# Patient Record
Sex: Female | Born: 1965 | Hispanic: No | Marital: Single
Health system: Southern US, Community
[De-identification: ages and names within clinical notes are randomized; demographics above are authoritative.]

---

## 2004-12-07 ENCOUNTER — Emergency Department: Payer: Self-pay | Admitting: Emergency Medicine

## 2004-12-08 ENCOUNTER — Ambulatory Visit: Payer: Self-pay | Admitting: Emergency Medicine

## 2006-08-05 ENCOUNTER — Encounter
Admission: RE | Admit: 2006-08-05 | Discharge: 2006-08-05 | Payer: Self-pay | Admitting: Physical Medicine and Rehabilitation

## 2007-12-14 ENCOUNTER — Emergency Department: Payer: Self-pay | Admitting: Unknown Physician Specialty

## 2007-12-14 ENCOUNTER — Other Ambulatory Visit: Payer: Self-pay

## 2008-04-23 ENCOUNTER — Ambulatory Visit: Payer: Self-pay

## 2017-04-13 ENCOUNTER — Ambulatory Visit: Payer: Self-pay | Attending: Oncology

## 2017-04-13 ENCOUNTER — Ambulatory Visit
Admission: RE | Admit: 2017-04-13 | Discharge: 2017-04-13 | Disposition: A | Discharge status: Home / Self Care | Payer: Self-pay | Source: Ambulatory Visit | Attending: Oncology | Admitting: Oncology

## 2017-04-13 VITALS — BP 114/79 | HR 92 | Temp 97.9°F | Ht 63.0 in | Wt 163.0 lb

## 2017-04-13 DIAGNOSIS — Z0000 Encounter for general adult medical examination without abnormal findings: Secondary | ICD-10-CM

## 2017-04-13 NOTE — Progress Notes (Signed)
Subjective:     Patient ID: Cynthia DonathClara Jaimes Oliver, female   DOB: May 15, 1966, 51 y.o.   MRN: [id number]  HPI   Review of Systems     Objective:   Physical Exam     Assessment:     See note    Plan:     See above

## 2017-04-13 NOTE — Progress Notes (Signed)
Subjective:     Patient ID: Cynthia DonathClara Jaimes Oliver, female   DOB: 06/28/1966, 51 y.o.   MRN: [id number]  HPI   Review of Systems     Objective:   Physical Exam  Pulmonary/Chest: Right breast exhibits no inverted nipple, no mass, no nipple discharge, no skin change and no tenderness. Left breast exhibits no inverted nipple, no mass, no nipple discharge, no skin change and no tenderness. Breasts are symmetrical.  Genitourinary: No labial fusion. There is no rash, tenderness, lesion or injury on the right labia. There is no rash, tenderness, lesion or injury on the left labia. Uterus is not deviated, not enlarged, not fixed and not tender. Cervix exhibits no motion tenderness, no discharge and no friability. Right adnexum displays no mass, no tenderness and no fullness. Left adnexum displays no mass, no tenderness and no fullness. No erythema, tenderness or bleeding in the vagina. No foreign body in the vagina. No signs of injury around the vagina. No vaginal discharge found.       Assessment:     51 year old hispanic patient presents for [facility] clinic visit.  Patient screened, and meets [facility] eligibility.  Patient does not have insurance, Medicare or Medicaid.  Handout given on Affordable Care Act. Instructed patient on breast self-exam using teach back method.  CBE unremarkable.  No mass or lump palpated.  Patient had a negative/positive pap in 2018. Pelvic exam normal. Cynthia Oliver interpreted exam.     Plan:     Sent for bilateral screening mammogram.  Specimen collected for pap.  Mailed patient information on HPV.

## 2017-04-14 ENCOUNTER — Other Ambulatory Visit: Payer: Self-pay

## 2017-04-14 DIAGNOSIS — N63 Unspecified lump in unspecified breast: Secondary | ICD-10-CM

## 2017-04-15 LAB — PAP LB AND HPV HIGH-RISK
HPV, high-risk: NEGATIVE
PAP SMEAR COMMENT: 0

## 2017-04-18 NOTE — Progress Notes (Signed)
Pap results Negative/Negative.   Patient to return for additional views.  Orders are in.

## 2017-04-27 ENCOUNTER — Ambulatory Visit
Admission: RE | Admit: 2017-04-27 | Discharge: 2017-04-27 | Disposition: A | Discharge status: Home / Self Care | Payer: Self-pay | Source: Ambulatory Visit | Attending: Oncology | Admitting: Oncology

## 2017-04-27 ENCOUNTER — Encounter: Payer: Self-pay | Admitting: Radiology

## 2017-04-27 DIAGNOSIS — N63 Unspecified lump in unspecified breast: Secondary | ICD-10-CM

## 2017-05-03 ENCOUNTER — Encounter: Payer: Self-pay | Admitting: Family Medicine

## 2017-05-25 ENCOUNTER — Other Ambulatory Visit: Payer: Self-pay

## 2017-05-25 DIAGNOSIS — N63 Unspecified lump in unspecified breast: Secondary | ICD-10-CM

## 2017-05-26 NOTE — Progress Notes (Signed)
Additional view mammogram results Birads 3.  Mailed 6 month follow-up appointment information to patient.  She is scheduled for left breast mammogram, and ultrasound on 10/27/17 at 10:00 [facility]

## 2017-10-04 NOTE — Progress Notes (Signed)
Patient called stating she is aware of her mammogram, and ultrasound appointment 10/27/17 at 10:00.

## 2017-10-27 ENCOUNTER — Ambulatory Visit
Admission: RE | Admit: 2017-10-27 | Discharge: 2017-10-27 | Disposition: A | Discharge status: Home / Self Care | Payer: Self-pay | Source: Ambulatory Visit | Attending: Oncology | Admitting: Oncology

## 2017-10-27 DIAGNOSIS — N6321 Unspecified lump in the left breast, upper outer quadrant: Secondary | ICD-10-CM | POA: Insufficient documentation

## 2017-10-27 DIAGNOSIS — N6322 Unspecified lump in the left breast, upper inner quadrant: Secondary | ICD-10-CM | POA: Insufficient documentation

## 2017-10-27 DIAGNOSIS — N6323 Unspecified lump in the left breast, lower outer quadrant: Secondary | ICD-10-CM | POA: Insufficient documentation

## 2017-10-27 DIAGNOSIS — N63 Unspecified lump in unspecified breast: Secondary | ICD-10-CM

## 2017-10-28 ENCOUNTER — Other Ambulatory Visit: Payer: Self-pay

## 2017-10-28 DIAGNOSIS — N63 Unspecified lump in unspecified breast: Secondary | ICD-10-CM

## 2017-10-28 NOTE — Progress Notes (Signed)
Six month follow-up mammogram results Birads 3.  Patient is scheduled for [facility] appointment, and annual and six month follow-up mammogram on Wednesday 05/03/18 at 1:00 with mammogram to follow at 2:20 p.m. Mailed appointment info to patient.  Copy to [facility].

## 2018-05-03 ENCOUNTER — Other Ambulatory Visit: Payer: Self-pay

## 2018-05-03 ENCOUNTER — Ambulatory Visit: Payer: Self-pay

## 2018-05-24 ENCOUNTER — Ambulatory Visit: Payer: Self-pay | Attending: Oncology

## 2019-02-08 ENCOUNTER — Other Ambulatory Visit: Payer: Self-pay | Admitting: *Deleted

## 2019-02-08 DIAGNOSIS — Z20822 Contact with and (suspected) exposure to covid-19: Secondary | ICD-10-CM

## 2019-02-08 NOTE — Addendum Note (Signed)
Addended by: Brigitte Pulse on: 02/08/2019 08:54 AM   Modules accepted: Orders

## 2019-02-13 LAB — NOVEL CORONAVIRUS, NAA: SARS-CoV-2, NAA: NOT DETECTED

## 2019-02-15 ENCOUNTER — Telehealth: Payer: Self-pay | Admitting: General Practice

## 2019-02-15 NOTE — Telephone Encounter (Signed)
Daughter and pt called in for Covid test results. Advised of Not Detected  Result.

## 2019-03-22 ENCOUNTER — Other Ambulatory Visit: Payer: Self-pay

## 2019-03-22 DIAGNOSIS — Z20822 Contact with and (suspected) exposure to covid-19: Secondary | ICD-10-CM

## 2019-03-23 LAB — NOVEL CORONAVIRUS, NAA: SARS-CoV-2, NAA: NOT DETECTED

## 2019-03-26 ENCOUNTER — Telehealth: Payer: Self-pay | Admitting: Family Medicine

## 2019-03-26 NOTE — Telephone Encounter (Signed)
Negative COVID results given. Patient results "NOT Detected." Caller expressed understanding. ° °

## 2019-07-06 ENCOUNTER — Other Ambulatory Visit: Payer: Self-pay

## 2019-07-06 DIAGNOSIS — Z20822 Contact with and (suspected) exposure to covid-19: Secondary | ICD-10-CM

## 2019-07-06 NOTE — Progress Notes (Unsigned)
nov 

## 2019-07-07 LAB — NOVEL CORONAVIRUS, NAA: SARS-CoV-2, NAA: NOT DETECTED

## 2019-12-10 IMAGING — US US BREAST*L* LIMITED INC AXILLA
1 series · 10 of 10 positions shown · non-contrast
Comparison: Previous exam(s).

CLINICAL DATA: Six-month follow-up for 2 probably benign left
breast masses.

EXAM:
DIGITAL DIAGNOSTIC UNILATERAL LEFT MAMMOGRAM WITH CAD AND TOMO
LEFT BREAST ULTRASOUND

[Series 1: us breast*left* limited inc axilla · 0.06mm/px · 10 of 10 slices shown]
[im 1/10]
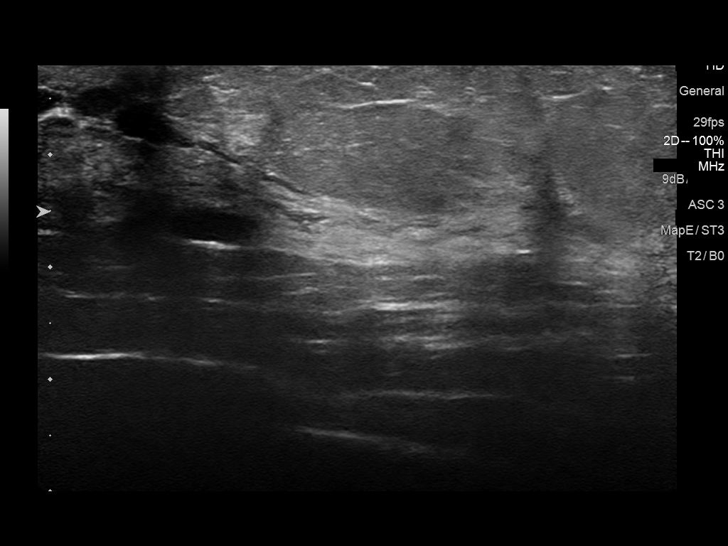
[im 2/10]
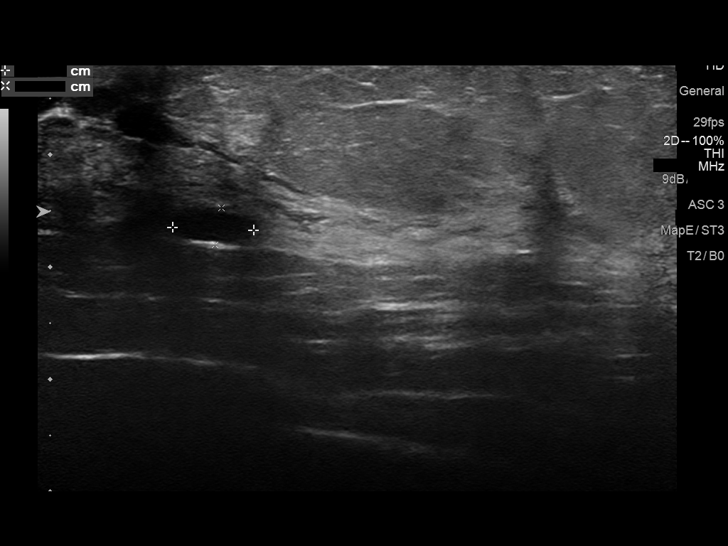
[im 3/10]
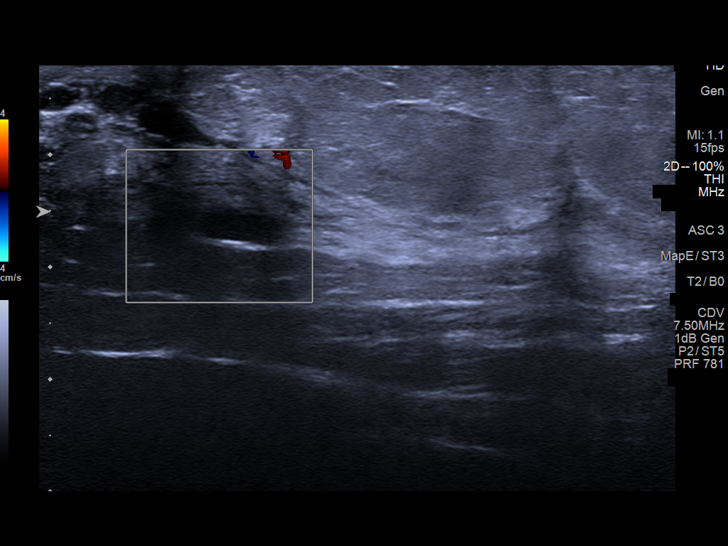
[im 4/10]
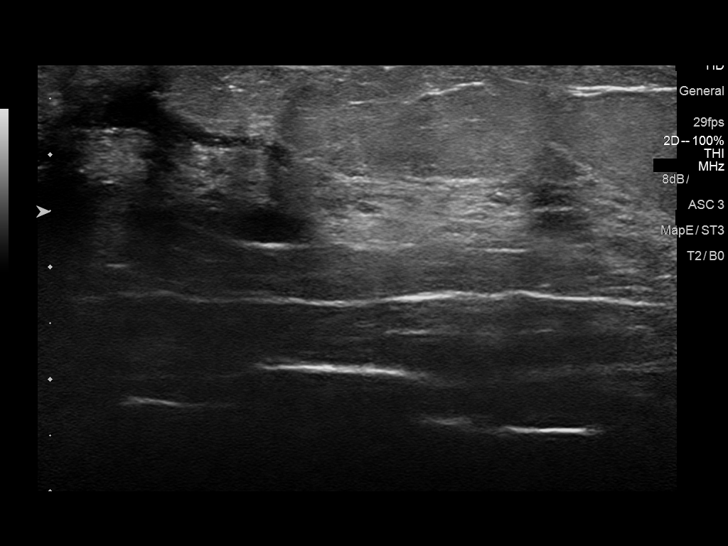
[im 5/10]
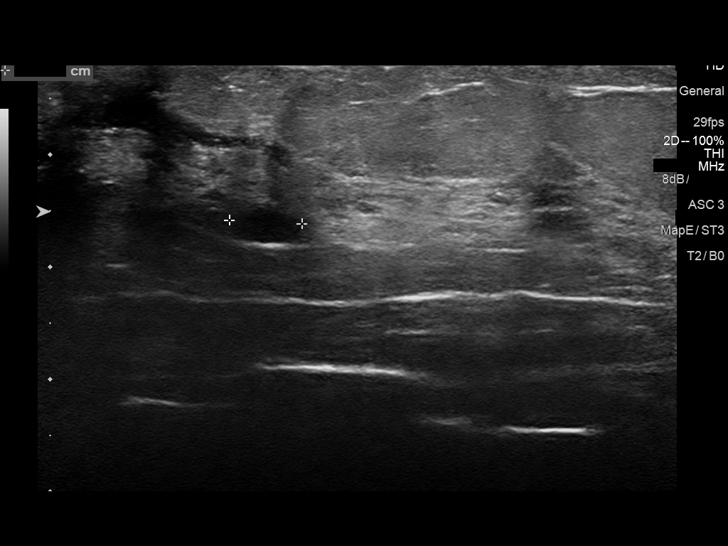
[im 6/10]
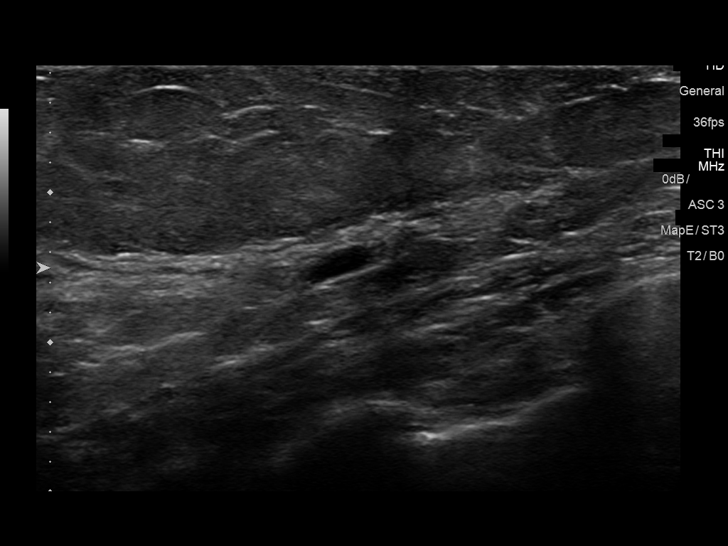
[im 7/10]
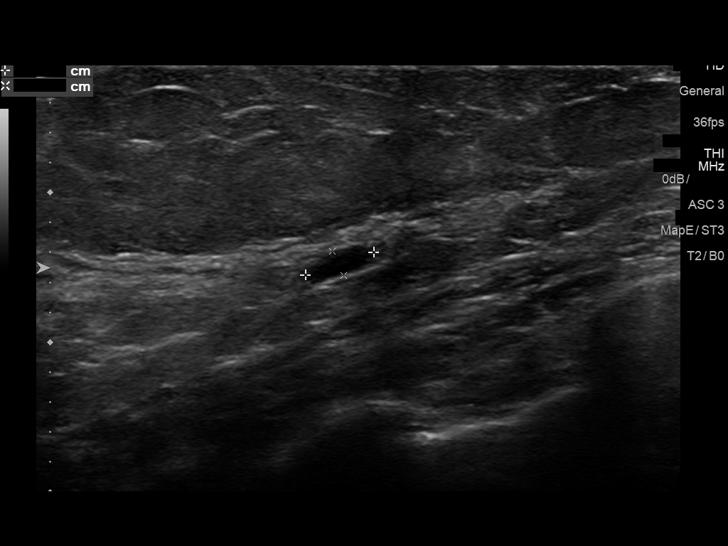
[im 8/10]
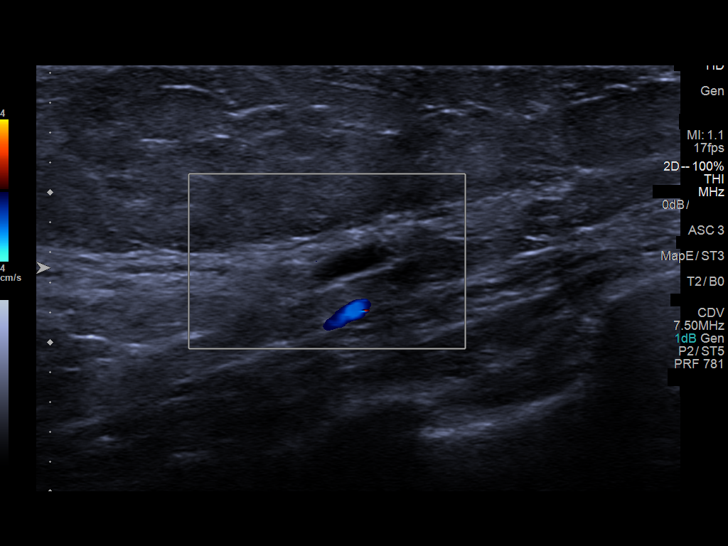
[im 9/10]
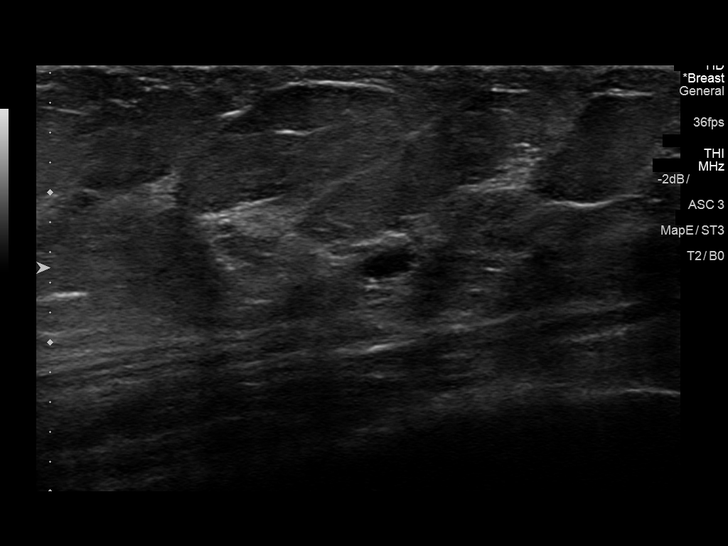
[im 10/10]
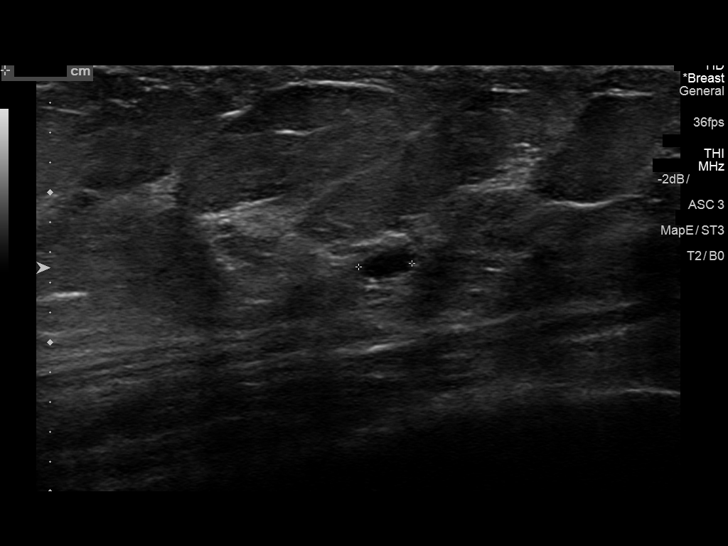

[10 of 10 positions shown; findings below may reference images not displayed]

ACR Breast Density Category c: The breast tissue is heterogeneously
dense, which may obscure small masses.
FINDINGS: The subcentimeter mass in the inferior left breast is
mammographically stable. The small retroareolar mass identified on
the prior ultrasound is not visible mammographically. No suspicious
calcifications, masses or areas of distortion are seen in the left
breast.

Mammographic images were processed with CAD.

Ultrasound targeted to the left breast at 5 o'clock, 3 cm from the
nipple demonstrates a stable oval hypoechoic mass measuring 5 x 2 x
4 mm, previously measuring 4 x 2 x 4 mm. The left breast
retroareolar mass at 12 o'clock measures 7 x 3 x 7 mm, previously 6
x 3 x 6 mm.
IMPRESSION: No significant interval change in the 2 small probably benign left
breast masses.

RECOMMENDATION:
Six-month follow-up bilateral diagnostic mammogram and left breast
ultrasound is recommended.

I have discussed the findings and recommendations with the patient.
Results were also provided in writing at the conclusion of the
visit. If applicable, a reminder letter will be sent to the patient
regarding the next appointment.

BI-RADS CATEGORY  3: Probably benign.

## 2020-01-16 ENCOUNTER — Other Ambulatory Visit: Payer: Self-pay

## 2020-01-16 ENCOUNTER — Ambulatory Visit (INDEPENDENT_AMBULATORY_CARE_PROVIDER_SITE_OTHER): Payer: Self-pay | Admitting: Dermatology

## 2020-01-16 DIAGNOSIS — L719 Rosacea, unspecified: Secondary | ICD-10-CM

## 2020-01-16 DIAGNOSIS — L988 Other specified disorders of the skin and subcutaneous tissue: Secondary | ICD-10-CM

## 2020-01-16 NOTE — Progress Notes (Signed)
   Follow-Up Visit   Subjective  Cynthia Oliver is a 54 y.o. female who presents for the following: Facial Elastosis (patient is here today for Botox injections).   The following portions of the chart were reviewed this encounter and updated as appropriate:  Allergies  Meds  Problems  Med Hx  Surg Hx  Fam Hx      Review of Systems:  No other skin or systemic complaints except as noted in HPI or Assessment and Plan.  Objective  Well appearing patient in no apparent distress; mood and affect are within normal limits.  A focused examination was performed including the face. Relevant physical exam findings are noted in the Assessment and Plan.  Objective  Face: Rhytides and volume loss.   Images                    Objective  Face: Pinkness of the face   Assessment & Plan    Elastosis of skin Face  Discussed fillers to the tear trough areas - will re-evaluate at follow up appointment in the fall.  Botox 30 units injected into: B/L crow's feet 5 units each for a total of 10 units Frown complex 20 units  Botox Injection - Face Location: See attached image  Informed consent: Discussed risks (infection, pain, bleeding, bruising, swelling, allergic reaction, paralysis of nearby muscles, eyelid droop, double vision, neck weakness, difficulty breathing, headache, undesirable cosmetic result, and need for additional treatment) and benefits of the procedure, as well as the alternatives.  Informed consent was obtained.  Preparation: The area was cleansed with alcohol.  Procedure Details:  Botox was injected into the dermis with a 30-gauge needle. Pressure applied to any bleeding. Ice packs offered for swelling.  Lot Number:  [id number] Expiration:  04/2022  Total Units Injected:  30  Plan: Patient was instructed to remain upright for 4 hours. Patient was instructed to avoid massaging the face and avoid vigorous exercise for the rest of the day. Tylenol  may be used for headache.  Allow 2 weeks before returning to clinic for additional dosing as needed. Patient will call for any problems.   Rosacea Face  Discussed BBL laser treatment in the fall/winter months  Return in about 4 months (around 05/17/2020) for Botox.  Maylene Roes, CMA, am acting as scribe for Armida Sans, MD .  Documentation: I have reviewed the above documentation for accuracy and completeness, and I agree with the above.  Armida Sans, MD

## 2020-01-20 ENCOUNTER — Encounter: Payer: Self-pay | Admitting: Dermatology

## 2020-05-06 ENCOUNTER — Other Ambulatory Visit: Payer: Self-pay

## 2020-05-06 ENCOUNTER — Ambulatory Visit (INDEPENDENT_AMBULATORY_CARE_PROVIDER_SITE_OTHER): Payer: Self-pay | Admitting: Dermatology

## 2020-05-06 DIAGNOSIS — L988 Other specified disorders of the skin and subcutaneous tissue: Secondary | ICD-10-CM

## 2020-05-06 NOTE — Progress Notes (Signed)
   Follow-Up Visit   Subjective  Cynthia Oliver is a 54 y.o. female who presents for the following: Facial Elastosis (patient is here today for Botox injections).  The following portions of the chart were reviewed this encounter and updated as appropriate:  Allergies  Meds  Problems  Med Hx  Surg Hx  Fam Hx     Review of Systems:  No other skin or systemic complaints except as noted in HPI or Assessment and Plan.  Objective  Well appearing patient in no apparent distress; mood and affect are within normal limits.  A focused examination was performed including the face. Relevant physical exam findings are noted in the Assessment and Plan.  Objective  Face: Rhytides and volume loss.   Images    Assessment & Plan  Elastosis of skin Face Botox 30 units injected as marked: - Frown complex 20 units - Crows feet 5 units each for a total of 10 units  Discussed tear trough areas to filled with Belotero. Do not recommend   Botox Injection - Face Location: See attached image  Informed consent: Discussed risks (infection, pain, bleeding, bruising, swelling, allergic reaction, paralysis of nearby muscles, eyelid droop, double vision, neck weakness, difficulty breathing, headache, undesirable cosmetic result, and need for additional treatment) and benefits of the procedure, as well as the alternatives.  Informed consent was obtained.  Preparation: The area was cleansed with alcohol.  Procedure Details:  Botox was injected into the dermis with a 30-gauge needle. Pressure applied to any bleeding. Ice packs offered for swelling.  Lot Number: [id number] Expiration:  06/2022  Total Units Injected:  30  Plan: Patient was instructed to remain upright for 4 hours. Patient was instructed to avoid massaging the face and avoid vigorous exercise for the rest of the day. Tylenol may be used for headache.  Allow 2 weeks before returning to clinic for additional dosing as needed. Patient  will call for any problems.   Return in about 4 months (around 09/06/2020) for cosmetic - Botox; patient would like to be scheduled sooner for fillers in the tear trough areas.  Maylene Roes, CMA, am acting as scribe for Armida Sans, MD .  Documentation: I have reviewed the above documentation for accuracy and completeness, and I agree with the above.  Armida Sans, MD

## 2020-05-07 ENCOUNTER — Encounter: Payer: Self-pay | Admitting: Dermatology

## 2020-06-04 ENCOUNTER — Ambulatory Visit (INDEPENDENT_AMBULATORY_CARE_PROVIDER_SITE_OTHER): Payer: Self-pay | Admitting: Dermatology

## 2020-06-04 ENCOUNTER — Encounter: Payer: Self-pay | Admitting: Dermatology

## 2020-06-04 ENCOUNTER — Other Ambulatory Visit: Payer: Self-pay

## 2020-06-04 DIAGNOSIS — L988 Other specified disorders of the skin and subcutaneous tissue: Secondary | ICD-10-CM

## 2020-06-04 NOTE — Progress Notes (Addendum)
   Follow-Up Visit   Subjective  Cynthia Oliver is a 54 y.o. female who presents for the following: Cosmetic evaluation and possible fillers today.  The following portions of the chart were reviewed this encounter and updated as appropriate:  Allergies  Meds  Problems  Med Hx  Surg Hx  Fam Hx     Review of Systems:  No other skin or systemic complaints except as noted in HPI or Assessment and Plan.  Objective  Well appearing patient in no apparent distress; mood and affect are within normal limits.  A focused examination was performed including face. Relevant physical exam findings are noted in the Assessment and Plan.  Objective  Head - Anterior (Face): Rhytides and volume loss.   Images                     Assessment & Plan  Elastosis of skin Head - Anterior (Face)  Will plan Belotero Balance to bilateral tear trough areas on follow up.  Restylane Refyne to oral commissure areas, nasolabial areas and corners of mouth today.  Filling material injection - Head - Anterior (Face) Prior to the procedure, the patient's past medical history, allergies and the rare but potential risks and complications were reviewed with the patient and a signed consent was obtained. Pre and post-treatment care was discussed and instructions provided.  Location: nasolabial folds, oral commissure areas, corners of mouth  Filler Type: Restylane Refyne Lot #[id number] Exp 11/29/2020  Procedure: The area was prepped thoroughly with Puracyn. After introducing the needle into the desired treatment area, the syringe plunger was drawn back to ensure there was no flash of blood prior to injecting the filler in order to minimize risk of intravascular injection and vascular occlusion. After injection of the filler, the treated areas were cleansed and iced to reduce swelling. Post-treatment instructions were reviewed with the patient.       Patient tolerated the procedure well. The patient  will call with any problems, questions or concerns prior to their next appointment.   Return for Fillers Belotero to tear trough areas.   I, Joanie Coddington, CMA, am acting as scribe for Armida Sans, MD .  Documentation: I have reviewed the above documentation for accuracy and completeness, and I agree with the above.  Armida Sans, MD

## 2020-07-10 ENCOUNTER — Other Ambulatory Visit: Payer: Self-pay

## 2020-07-10 ENCOUNTER — Ambulatory Visit (INDEPENDENT_AMBULATORY_CARE_PROVIDER_SITE_OTHER): Payer: Self-pay | Admitting: Dermatology

## 2020-07-10 DIAGNOSIS — R21 Rash and other nonspecific skin eruption: Secondary | ICD-10-CM

## 2020-07-10 NOTE — Progress Notes (Signed)
   Follow-Up Visit   Subjective  Cynthia Oliver is a 54 y.o. female who presents for the following: Facial Elastosis (She is here for Belotero injections under her eyes today. Her face is red and peeling from a cream that she has been using for Melasma.).  The following portions of the chart were reviewed this encounter and updated as appropriate:   Allergies  Meds  Problems  Med Hx  Surg Hx  Fam Hx     Review of Systems:  No other skin or systemic complaints except as noted in HPI or Assessment and Plan.  Objective  Well appearing patient in no apparent distress; mood and affect are within normal limits.  A focused examination was performed including face. Relevant physical exam findings are noted in the Assessment and Plan.  Objective  Face: Erythema and peeling  Images         Assessment & Plan  Rash - Contact Dermatitis to Skin lightener cream.  Pt has very sensitive skin - just used 1 day. Face Advised patient we should wait to inject Bleotero until after her rash has calmed down. She is scheduled for 08/07/2020 and we will plan to inject at that appointment.  Advised patient to discontinue cream that she is currently using until her follow up appointment. Asked patient to bring cream with her to her next appointment so we can document exactly what she is using.  Halog ointment qhs - sample given.  Return for Follow up as scheduled.   I, Joanie Coddington, CMA, am acting as scribe for Armida Sans, MD    .Documentation: I have reviewed the above documentation for accuracy and completeness, and I agree with the above.  Armida Sans, MD

## 2020-07-17 ENCOUNTER — Encounter: Payer: Self-pay | Admitting: Dermatology

## 2020-08-07 ENCOUNTER — Other Ambulatory Visit: Payer: Self-pay

## 2020-08-07 ENCOUNTER — Ambulatory Visit (INDEPENDENT_AMBULATORY_CARE_PROVIDER_SITE_OTHER): Payer: Self-pay | Admitting: Dermatology

## 2020-08-07 DIAGNOSIS — L988 Other specified disorders of the skin and subcutaneous tissue: Secondary | ICD-10-CM

## 2020-08-07 NOTE — Progress Notes (Signed)
   Follow-Up Visit   Subjective  Cynthia Oliver is a 55 y.o. female who presents for the following: Facial Elastosis (Belotero injections today).  The following portions of the chart were reviewed this encounter and updated as appropriate:   Allergies  Meds  Problems  Med Hx  Surg Hx  Fam Hx     Review of Systems:  No other skin or systemic complaints except as noted in HPI or Assessment and Plan.  Objective  Well appearing patient in no apparent distress; mood and affect are within normal limits.  A focused examination was performed including face. Relevant physical exam findings are noted in the Assessment and Plan.  Objective  Bilateral tear trough areas: Rhytides and volume loss.   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 Pre and post-treatment care was discussed and instructions provided.  Location: Bilateral tear trough areas  Filler Type: Belotero  Lot #[id number] Exp 10/16/2020  Procedure: The area was prepped thoroughly with Puracyn. A cannula was used to inject the filler. Insertion sites were prepped with puracyn. A 23-gauge needle was used to create an opening at the insertion site and then the cannula was inserted using sterile technique. Prior to injecting filler at the desired location, the syringe plunger was drawn back to ensure there was no flash of blood in order to minimize risk of intravascular injection and vascular occlusion.  After injection of the filler, the treated areas were cleansed and iced to reduce swelling. Post-treatment instructions were reviewed with the patient.       Patient tolerated the procedure well. The patient will call with any  problems, questions or concerns prior to their next appointment.   Return for Follow up as scheduled.   I, Joanie Coddington, CMA, am acting as scribe for Armida Sans, MD .  Documentation: I have reviewed the above documentation for accuracy and completeness, and I agree with the above.  Armida Sans, MD

## 2020-08-19 ENCOUNTER — Encounter: Payer: Self-pay | Admitting: Dermatology

## 2020-09-09 ENCOUNTER — Ambulatory Visit (INDEPENDENT_AMBULATORY_CARE_PROVIDER_SITE_OTHER): Payer: Self-pay | Admitting: Dermatology

## 2020-09-09 ENCOUNTER — Other Ambulatory Visit: Payer: Self-pay

## 2020-09-09 DIAGNOSIS — L988 Other specified disorders of the skin and subcutaneous tissue: Secondary | ICD-10-CM

## 2020-09-09 NOTE — Progress Notes (Signed)
   Follow-Up Visit   Subjective  Cynthia Oliver is a 55 y.o. female who presents for the following: Facial Elastosis (Face, pt presents for Botox today, last treatment 05/06/20).  The following portions of the chart were reviewed this encounter and updated as appropriate:   Allergies  Meds  Problems  Med Hx  Surg Hx  Fam Hx     Review of Systems:  No other skin or systemic complaints except as noted in HPI or Assessment and Plan.  Objective  Well appearing patient in no apparent distress; mood and affect are within normal limits.  A focused examination was performed including face. Relevant physical exam findings are noted in the Assessment and Plan.  Objective  face: Rhytides and volume loss.   Images     Assessment & Plan  Elastosis of skin face  Botox 30 units injected today to: - Frown complex 20 units - Crows feet 5 units x 2  Botox Injection - face Location: Frown complex, bil crow's feet  Informed consent: Discussed risks (infection, pain, bleeding, bruising, swelling, allergic reaction, paralysis of nearby muscles, eyelid droop, double vision, neck weakness, difficulty breathing, headache, undesirable cosmetic result, and need for additional treatment) and benefits of the procedure, as well as the alternatives.  Informed consent was obtained.  Preparation: The area was cleansed with alcohol.  Procedure Details:  Botox was injected into the dermis with a 30-gauge needle. Pressure applied to any bleeding. Ice packs offered for swelling.  Lot Number:  [id number] Expiration:  11/2022  Total Units Injected:  30 units  Plan: Patient was instructed to remain upright for 4 hours. Patient was instructed to avoid massaging the face and avoid vigorous exercise for the rest of the day. Tylenol may be used for headache.  Allow 2 weeks before returning to clinic for additional dosing as needed. Patient will call for any problems.   Return for 3-52m Botox.   I,  Ardis Rowan, RMA, am acting as scribe for Armida Sans, MD .  Documentation: I have reviewed the above documentation for accuracy and completeness, and I agree with the above.  Armida Sans, MD

## 2020-09-13 ENCOUNTER — Encounter: Payer: Self-pay | Admitting: Dermatology

## 2021-01-20 ENCOUNTER — Other Ambulatory Visit: Payer: Self-pay

## 2021-01-20 ENCOUNTER — Ambulatory Visit (INDEPENDENT_AMBULATORY_CARE_PROVIDER_SITE_OTHER): Payer: Self-pay | Admitting: Dermatology

## 2021-01-20 DIAGNOSIS — I781 Nevus, non-neoplastic: Secondary | ICD-10-CM

## 2021-01-20 DIAGNOSIS — L988 Other specified disorders of the skin and subcutaneous tissue: Secondary | ICD-10-CM

## 2021-01-20 NOTE — Progress Notes (Signed)
   Follow-Up Visit   Subjective  Cynthia Oliver is a 55 y.o. female who presents for the following: Facial Elastosis (Patient is here today for Botox injections.).  The following portions of the chart were reviewed this encounter and updated as appropriate:   Allergies  Meds  Problems  Med Hx  Surg Hx  Fam Hx      Review of Systems:  No other skin or systemic complaints except as noted in HPI or Assessment and Plan.  Objective  Well appearing patient in no apparent distress; mood and affect are within normal limits.  A focused examination was performed including the face. Relevant physical exam findings are noted in the Assessment and Plan.  Face Rhytides and volume loss.      Face Dilated vessels of the cheeks, chin, and nose.  Assessment & Plan  Elastosis of skin Face  Botox 30 units injected as marked - Frown complex 20 units - Crow's feet 5 units each for a total of 10 units  Botox Injection - Face Location: See attached image  Informed consent: Discussed risks (infection, pain, bleeding, bruising, swelling, allergic reaction, paralysis of nearby muscles, eyelid droop, double vision, neck weakness, difficulty breathing, headache, undesirable cosmetic result, and need for additional treatment) and benefits of the procedure, as well as the alternatives.  Informed consent was obtained.  Preparation: The area was cleansed with alcohol.  Procedure Details:  Botox was injected into the dermis with a 30-gauge needle. Pressure applied to any bleeding. Ice packs offered for swelling.  Lot Number:  [id number] Expiration:  05/2022  Total Units Injected:  30  Plan: Patient was instructed to remain upright for 4 hours. Patient was instructed to avoid massaging the face and avoid vigorous exercise for the rest of the day. Tylenol may be used for headache.  Allow 2 weeks before returning to clinic for additional dosing as needed. Patient will call for any  problems.   Telangiectasia Face  Discussed BBL laser treatment - advised patient of out of pocket cost of $350 per session.  Return for Botox in 3-4 mths; BBL laser for telangiectasia with Dr. Verdell Face, Cynthia Oliver, CMA, am acting as scribe for Armida Sans, MD .  Documentation: I have reviewed the above documentation for accuracy and completeness, and I agree with the above.  Armida Sans, MD

## 2021-01-20 NOTE — Patient Instructions (Signed)

## 2021-01-23 ENCOUNTER — Encounter: Payer: Self-pay | Admitting: Dermatology

## 2021-04-02 ENCOUNTER — Ambulatory Visit: Payer: Self-pay | Admitting: Dermatology

## 2021-05-19 ENCOUNTER — Other Ambulatory Visit: Payer: Self-pay

## 2021-05-19 ENCOUNTER — Ambulatory Visit (INDEPENDENT_AMBULATORY_CARE_PROVIDER_SITE_OTHER): Payer: Self-pay | Admitting: Dermatology

## 2021-05-19 ENCOUNTER — Encounter: Payer: Self-pay | Admitting: Dermatology

## 2021-05-19 DIAGNOSIS — L988 Other specified disorders of the skin and subcutaneous tissue: Secondary | ICD-10-CM

## 2021-05-19 NOTE — Patient Instructions (Signed)

## 2021-05-19 NOTE — Progress Notes (Signed)
   Follow-Up Visit   Subjective  Cynthia Oliver is a 55 y.o. female who presents for the following: Facial Elastosis (Patient here today for Botox injections.).  The following portions of the chart were reviewed this encounter and updated as appropriate:   Allergies  Meds  Problems  Med Hx  Surg Hx  Fam Hx     Review of Systems:  No other skin or systemic complaints except as noted in HPI or Assessment and Plan.  Objective  Well appearing patient in no apparent distress; mood and affect are within normal limits.  A focused examination was performed including the face. Relevant physical exam findings are noted in the Assessment and Plan.  Face Rhytides and volume loss.       Assessment & Plan  Elastosis of skin Face  Botox 30 units injected as marked: - Frown complex 20 units - Crow's feet 5 units each for a total of 10 units  Botox Injection - Face Location: See attached image  Informed consent: Discussed risks (infection, pain, bleeding, bruising, swelling, allergic reaction, paralysis of nearby muscles, eyelid droop, double vision, neck weakness, difficulty breathing, headache, undesirable cosmetic result, and need for additional treatment) and benefits of the procedure, as well as the alternatives.  Informed consent was obtained.  Preparation: The area was cleansed with alcohol.  Procedure Details:  Botox was injected into the dermis with a 30-gauge needle. Pressure applied to any bleeding. Ice packs offered for swelling.  Lot Number:  [id number] Expiration:  01/2023  Total Units Injected:  30  Plan: Patient was instructed to remain upright for 4 hours. Patient was instructed to avoid massaging the face and avoid vigorous exercise for the rest of the day. Tylenol may be used for headache.  Allow 2 weeks before returning to clinic for additional dosing as needed. Patient will call for any problems.   Return in about 3 months (around 08/19/2021) for Botox and  filler .  Maylene Roes, CMA, am acting as scribe for Armida Sans, MD . Documentation: I have reviewed the above documentation for accuracy and completeness, and I agree with the above.  Armida Sans, MD

## 2021-09-22 ENCOUNTER — Ambulatory Visit: Payer: Self-pay | Admitting: Dermatology

## 2021-09-23 ENCOUNTER — Ambulatory Visit: Payer: Self-pay | Admitting: Dermatology

## 2021-10-13 ENCOUNTER — Other Ambulatory Visit: Payer: Self-pay

## 2021-10-13 ENCOUNTER — Encounter: Payer: Self-pay | Admitting: Dermatology

## 2021-10-13 ENCOUNTER — Ambulatory Visit (INDEPENDENT_AMBULATORY_CARE_PROVIDER_SITE_OTHER): Payer: Self-pay | Admitting: Dermatology

## 2021-10-13 DIAGNOSIS — L988 Other specified disorders of the skin and subcutaneous tissue: Secondary | ICD-10-CM

## 2021-10-13 DIAGNOSIS — L811 Chloasma: Secondary | ICD-10-CM

## 2021-10-13 NOTE — Progress Notes (Signed)
? ? ?Follow-Up Visit ?  ?Subjective  ?Cynthia Oliver is a 56 y.o. female who presents for the following: Facial Elastosis (Here for Botox and fillers. Patient states the last Rx sent to [facility] formulation for Melasma tx does not work as well as the first one sent). ? ?Daughter with patient and contributes to history.  ? ?The following portions of the chart were reviewed this encounter and updated as appropriate:  Allergies  Meds  Problems  Med Hx  Surg Hx  Fam Hx   ?  ?Review of Systems: No other skin or systemic complaints except as noted in HPI or Assessment and Plan. ? ?Objective  ?Well appearing patient in no apparent distress; mood and affect are within normal limits. ? ?A focused examination was performed including face. Relevant physical exam findings are noted in the Assessment and Plan. ? ?Head - Anterior (Face) ?Rhytides and volume loss.  ? ? ? ? ? ? ? ? ? ? ? ? ? ? ? ? ? ? ? ? ? ? ?face ?Reticulated hyperpigmented patches.  ? ? ?Assessment & Plan  ?Elastosis of skin ?Head - Anterior (Face) ?Botox 30 units injected as marked: ?- Frown complex 20 units ?- Crow's feet 5 units each for a total of 10 units ? ?1cc syringe of Restylane Refyne of oral commissures and chin and marionette lines and scars of R cheek. ? ?Botox Injection - Head - Anterior (Face) ?Location: See attached image ? ?Informed consent: Discussed risks (infection, pain, bleeding, bruising, swelling, allergic reaction, paralysis of nearby muscles, eyelid droop, double vision, neck weakness, difficulty breathing, headache, undesirable cosmetic result, and need for additional treatment) and benefits of the procedure, as well as the alternatives.  Informed consent was obtained. ? ?Preparation: The area was cleansed with alcohol. ? ?Procedure Details:  Botox was injected into the dermis with a 30-gauge needle. Pressure applied to any bleeding. Ice packs offered for swelling. ? ?Lot Number:  [id number] ?Expiration:   10/2023 ? ?Total Units Injected:  30 ? ?Plan: Patient was instructed to remain upright for 4 hours. Patient was instructed to avoid massaging the face and avoid vigorous exercise for the rest of the day. Tylenol may be used for headache.  Allow 2 weeks before returning to clinic for additional dosing as needed. Patient will call for any problems. ? ?Filling material injection - Head - Anterior (Face) ?Prior to the procedure, the patient's past medical history, allergies and the rare but potential risks and complications were reviewed with the patient and a signed consent was obtained. Pre and post-treatment care was discussed and instructions provided. ? ?Location: right cheek, oral commissures bilaterally ? ?Filler Type: Restylane refyne ? ?Procedure: The area was prepped thoroughly with Puracyn. After introducing the needle into the desired treatment area, the syringe plunger was drawn back to ensure there was no flash of blood prior to injecting the filler in order to minimize risk of intravascular injection and vascular occlusion. After injection of the filler, the treated areas were cleansed and iced to reduce swelling. Post-treatment instructions were reviewed with the patient.      ? ?Patient tolerated the procedure well. The patient will call with any problems, questions or concerns prior to their next appointment. ? ?Lot: [id number] ?Exp: 09/29/2022 ? ?Melasma ?Face ?Chronic and persistent condition with duration or expected duration over one year. Condition is symptomatic/ bothersome to patient. Not currently at goal. ?Melasma is a chronic condition of persistent pigmented patches generally on the face, worse  in summer due to higher UV exposure.  Oral estrogen containing BCPs or supplements can exacerbate condition.  Recommend daily broad spectrum tinted sunscreen SPF 30+ to face, preferably with Zinc or Titanium Dioxide. Discussed Rx topical bleaching creams (i.e. hydroquinone), OTC HelioCare supplement,  chemical peels (would need multiple for best result).  ? ?Start: Hydroquinone: 8%, Tretinoin: 0.025%, Kojic Acid: 1%, Niacinamide: 4%, Fluocinolone: 0.025% cream at bedtime up to 3 months.  ? ?Instructions for [facility] ?One or more of your medications was sent to the [facility] mail order compounding pharmacy. You will receive an email from them and can purchase the medicine through that link. It will then be mailed to your home at the address you confirmed. If for any reason you do not receive an email from them, please check your spam folder. If you still do not find the email, please let us know. [facility] phone number is [phone]. ? ?Return in about 4 months (around 02/12/2022) for Botox Follow Up. ? ?I, Emelia Salisbury, CMA, am acting as scribe for Sarina Ser, MD. ?Documentation: I have reviewed the above documentation for accuracy and completeness, and I agree with the above. ? ?Sarina Ser, MD ? ? ?

## 2021-10-13 NOTE — Patient Instructions (Signed)

## 2021-10-19 ENCOUNTER — Encounter: Payer: Self-pay | Admitting: Dermatology

## 2022-02-16 ENCOUNTER — Ambulatory Visit (INDEPENDENT_AMBULATORY_CARE_PROVIDER_SITE_OTHER): Payer: Self-pay | Admitting: Dermatology

## 2022-02-16 DIAGNOSIS — L988 Other specified disorders of the skin and subcutaneous tissue: Secondary | ICD-10-CM

## 2022-02-16 NOTE — Progress Notes (Signed)
   Follow-Up Visit   Subjective  Cynthia Oliver is a 56 y.o. female who presents for the following: Facial Elastosis (Face, pt presents for botox).  Patient accompanied by daughter who contributes to history.  The following portions of the chart were reviewed this encounter and updated as appropriate:   Allergies  Meds  Problems  Med Hx  Surg Hx  Fam Hx      Review of Systems:  No other skin or systemic complaints except as noted in HPI or Assessment and Plan.  Objective  Well appearing patient in no apparent distress; mood and affect are within normal limits.  A focused examination was performed including face. Relevant physical exam findings are noted in the Assessment and Plan.  face Rhytides and volume loss.        Assessment & Plan  Elastosis of skin face  Botox 30 units injected as marked: - Frown complex 20 units - Crow's feet 5 units each for a total of 10 units  Discussed adding filler in 3-66m, will re-evaluate at next botox appointment  Botox Injection - face Location: frown complex, crows feet  Informed consent: Discussed risks (infection, pain, bleeding, bruising, swelling, allergic reaction, paralysis of nearby muscles, eyelid droop, double vision, neck weakness, difficulty breathing, headache, undesirable cosmetic result, and need for additional treatment) and benefits of the procedure, as well as the alternatives.  Informed consent was obtained.  Preparation: The area was cleansed with alcohol.  Procedure Details:  Botox was injected into the dermis with a 30-gauge needle. Pressure applied to any bleeding. Ice packs offered for swelling.  Lot Number:  [id number] Expiration:  12/2023  Total Units Injected:  30  Plan: Patient was instructed to remain upright for 4 hours. Patient was instructed to avoid massaging the face and avoid vigorous exercise for the rest of the day. Tylenol may be used for headache.  Allow 2 weeks before returning to  clinic for additional dosing as needed. Patient will call for any problems.    Return for 3-74m Botox.  I, Ardis Rowan, RMA, am acting as scribe for Armida Sans, MD . Documentation: I have reviewed the above documentation for accuracy and completeness, and I agree with the above.  Armida Sans, MD

## 2022-02-16 NOTE — Patient Instructions (Signed)
Due to recent changes in healthcare laws, you may see results of your pathology and/or laboratory studies on MyChart before the doctors have had a chance to review them. We understand that in some cases there may be results that are confusing or concerning to you. Please understand that not all results are received at the same time and often the doctors may need to interpret multiple results in order to provide you with the best plan of care or course of treatment. Therefore, we ask that you please give us 2 business days to thoroughly review all your results before contacting the office for clarification. Should we see a critical lab result, you will be contacted sooner.   If You Need Anything After Your Visit  If you have any questions or concerns for your doctor, please call our main line at [phone] and press option 4 to reach your doctor's medical assistant. If no one answers, please leave a voicemail as directed and we will return your call as soon as possible. Messages left after 4 pm will be answered the following business day.   You may also send us a message via MyChart. We typically respond to MyChart messages within 1-2 business days.  For prescription refills, please ask your pharmacy to contact our office. Our fax number is [phone].  If you have an urgent issue when the clinic is closed that cannot wait until the next business day, you can page your doctor at the number below.    Please note that while we do our best to be available for urgent issues outside of office hours, we are not available 24/7.   If you have an urgent issue and are unable to reach us, you may choose to seek medical care at your doctor's office, retail clinic, urgent care center, or emergency room.  If you have a medical emergency, please immediately call 911 or go to the emergency department.  Pager Numbers  - Dr. Kowalski: [phone]  - Dr. Moye: [phone]  - Dr. Stewart:  [phone]  In the event of inclement weather, please call our main line at [phone] for an update on the status of any delays or closures.  Dermatology Medication Tips: Please keep the boxes that topical medications come in in order to help keep track of the instructions about where and how to use these. Pharmacies typically print the medication instructions only on the boxes and not directly on the medication tubes.   If your medication is too expensive, please contact our office at [phone] option 4 or send us a message through MyChart.   We are unable to tell what your co-pay for medications will be in advance as this is different depending on your insurance coverage. However, we may be able to find a substitute medication at lower cost or fill out paperwork to get insurance to cover a needed medication.   If a prior authorization is required to get your medication covered by your insurance company, please allow us 1-2 business days to complete this process.  Drug prices often vary depending on where the prescription is filled and some pharmacies may offer cheaper prices.  The website www.goodrx.com contains coupons for medications through different pharmacies. The prices here do not account for what the cost may be with help from insurance (it may be cheaper with your insurance), but the website can give you the price if you did not use any insurance.  - You can print the associated coupon and take it with   your prescription to the pharmacy.  - You may also stop by our office during regular business hours and pick up a GoodRx coupon card.  - If you need your prescription sent electronically to a different pharmacy, notify our office through [facility] MyChart or by phone at [phone] option 4.     Si Usted Necesita Algo Despus de Su Visita  Tambin puede enviarnos un mensaje a travs de MyChart. Por lo general respondemos a los mensajes de MyChart en el transcurso de 1 a 2  das hbiles.  Para renovar recetas, por favor pida a su farmacia que se ponga en contacto con nuestra oficina. Nuestro nmero de fax es el [phone].  Si tiene un asunto urgente cuando la clnica est cerrada y que no puede esperar hasta el siguiente da hbil, puede llamar/localizar a su doctor(a) al nmero que aparece a continuacin.   Por favor, tenga en cuenta que aunque hacemos todo lo posible para estar disponibles para asuntos urgentes fuera del horario de oficina, no estamos disponibles las 24 horas del da, los 7 das de la semana.   Si tiene un problema urgente y no puede comunicarse con nosotros, puede optar por buscar atencin mdica  en el consultorio de su doctor(a), en una clnica privada, en un centro de atencin urgente o en una sala de emergencias.  Si tiene una emergencia mdica, por favor llame inmediatamente al 911 o vaya a la sala de emergencias.  Nmeros de bper  - Dr. Kowalski: [phone]  - Dra. Moye: [phone]  - Dra. Stewart: [phone]  En caso de inclemencias del tiempo, por favor llame a nuestra lnea principal al [phone] para una actualizacin sobre el estado de cualquier retraso o cierre.  Consejos para la medicacin en dermatologa: Por favor, guarde las cajas en las que vienen los medicamentos de uso tpico para ayudarle a seguir las instrucciones sobre dnde y cmo usarlos. Las farmacias generalmente imprimen las instrucciones del medicamento slo en las cajas y no directamente en los tubos del medicamento.   Si su medicamento es muy caro, por favor, pngase en contacto con nuestra oficina llamando al [phone] y presione la opcin 4 o envenos un mensaje a travs de MyChart.   No podemos decirle cul ser su copago por los medicamentos por adelantado ya que esto es diferente dependiendo de la cobertura de su seguro. Sin embargo, es posible que podamos encontrar un medicamento sustituto a menor costo o llenar un formulario para que el  seguro cubra el medicamento que se considera necesario.   Si se requiere una autorizacin previa para que su compaa de seguros cubra su medicamento, por favor permtanos de 1 a 2 das hbiles para completar este proceso.  Los precios de los medicamentos varan con frecuencia dependiendo del lugar de dnde se surte la receta y alguna farmacias pueden ofrecer precios ms baratos.  El sitio web www.goodrx.com tiene cupones para medicamentos de diferentes farmacias. Los precios aqu no tienen en cuenta lo que podra costar con la ayuda del seguro (puede ser ms barato con su seguro), pero el sitio web puede darle el precio si no utiliz ningn seguro.  - Puede imprimir el cupn correspondiente y llevarlo con su receta a la farmacia.  - Tambin puede pasar por nuestra oficina durante el horario de atencin regular y recoger una tarjeta de cupones de GoodRx.  - Si necesita que su receta se enve electrnicamente a una farmacia diferente, informe a nuestra oficina a travs de MyChart de [place]   o por telfono llamando al [phone] y presione la opcin 4.  

## 2022-02-26 ENCOUNTER — Encounter: Payer: Self-pay | Admitting: Dermatology

## 2022-05-25 ENCOUNTER — Ambulatory Visit (INDEPENDENT_AMBULATORY_CARE_PROVIDER_SITE_OTHER): Payer: Self-pay | Admitting: Dermatology

## 2022-05-25 DIAGNOSIS — L988 Other specified disorders of the skin and subcutaneous tissue: Secondary | ICD-10-CM

## 2022-05-25 NOTE — Progress Notes (Signed)
   Follow-Up Visit   Subjective  Cynthia Oliver is a 56 y.o. female who presents for the following: Facial Elastosis (Patient is here today for Botox injections).  The following portions of the chart were reviewed this encounter and updated as appropriate:   Allergies  Meds  Problems  Med Hx  Surg Hx  Fam Hx      Review of Systems:  No other skin or systemic complaints except as noted in HPI or Assessment and Plan.  Objective  Well appearing patient in no apparent distress; mood and affect are within normal limits.  A focused examination was performed including the face. Relevant physical exam findings are noted in the Assessment and Plan.  Face Rhytides and volume loss.       Assessment & Plan  Elastosis of skin Face  Botox 30 units injected as marked:  - Frown complex 20 units - Crow's feet 5 units each for a total of 10 units  Recommend 1 cc of Restylane Refyne to the nasolabial folds at next Botox appointment.  Botox Injection - Face Location: See attached image  Informed consent: Discussed risks (infection, pain, bleeding, bruising, swelling, allergic reaction, paralysis of nearby muscles, eyelid droop, double vision, neck weakness, difficulty breathing, headache, undesirable cosmetic result, and need for additional treatment) and benefits of the procedure, as well as the alternatives.  Informed consent was obtained.  Preparation: The area was cleansed with alcohol.  Procedure Details:  Botox was injected into the dermis with a 30-gauge needle. Pressure applied to any bleeding. Ice packs offered for swelling.  Lot Number:  [id number] Expiration:  07/2024  Total Units Injected:  30  Plan: Patient was instructed to remain upright for 4 hours. Patient was instructed to avoid massaging the face and avoid vigorous exercise for the rest of the day. Tylenol may be used for headache.  Allow 2 weeks before returning to clinic for additional dosing as needed.  Patient will call for any problems.    Return in about 4 months (around 09/25/2022) for Botox and filler .  Luther Redo, CMA, am acting as scribe for Sarina Ser, MD . Documentation: I have reviewed the above documentation for accuracy and completeness, and I agree with the above.  Sarina Ser, MD

## 2022-05-25 NOTE — Patient Instructions (Signed)
Due to recent changes in healthcare laws, you may see results of your pathology and/or laboratory studies on MyChart before the doctors have had a chance to review them. We understand that in some cases there may be results that are confusing or concerning to you. Please understand that not all results are received at the same time and often the doctors may need to interpret multiple results in order to provide you with the best plan of care or course of treatment. Therefore, we ask that you please give us 2 business days to thoroughly review all your results before contacting the office for clarification. Should we see a critical lab result, you will be contacted sooner.   If You Need Anything After Your Visit  If you have any questions or concerns for your doctor, please call our main line at [phone] and press option 4 to reach your doctor's medical assistant. If no one answers, please leave a voicemail as directed and we will return your call as soon as possible. Messages left after 4 pm will be answered the following business day.   You may also send us a message via MyChart. We typically respond to MyChart messages within 1-2 business days.  For prescription refills, please ask your pharmacy to contact our office. Our fax number is [phone].  If you have an urgent issue when the clinic is closed that cannot wait until the next business day, you can page your doctor at the number below.    Please note that while we do our best to be available for urgent issues outside of office hours, we are not available 24/7.   If you have an urgent issue and are unable to reach us, you may choose to seek medical care at your doctor's office, retail clinic, urgent care center, or emergency room.  If you have a medical emergency, please immediately call 911 or go to the emergency department.  Pager Numbers  - Dr. Kowalski: [phone]  - Dr. Moye: [phone]  - Dr. Stewart:  [phone]  In the event of inclement weather, please call our main line at [phone] for an update on the status of any delays or closures.  Dermatology Medication Tips: Please keep the boxes that topical medications come in in order to help keep track of the instructions about where and how to use these. Pharmacies typically print the medication instructions only on the boxes and not directly on the medication tubes.   If your medication is too expensive, please contact our office at [phone] option 4 or send us a message through MyChart.   We are unable to tell what your co-pay for medications will be in advance as this is different depending on your insurance coverage. However, we may be able to find a substitute medication at lower cost or fill out paperwork to get insurance to cover a needed medication.   If a prior authorization is required to get your medication covered by your insurance company, please allow us 1-2 business days to complete this process.  Drug prices often vary depending on where the prescription is filled and some pharmacies may offer cheaper prices.  The website www.goodrx.com contains coupons for medications through different pharmacies. The prices here do not account for what the cost may be with help from insurance (it may be cheaper with your insurance), but the website can give you the price if you did not use any insurance.  - You can print the associated coupon and take it with   your prescription to the pharmacy.  - You may also stop by our office during regular business hours and pick up a GoodRx coupon card.  - If you need your prescription sent electronically to a different pharmacy, notify our office through [facility] MyChart or by phone at [phone] option 4.     Si Usted Necesita Algo Despus de Su Visita  Tambin puede enviarnos un mensaje a travs de MyChart. Por lo general respondemos a los mensajes de MyChart en el transcurso de 1 a 2  das hbiles.  Para renovar recetas, por favor pida a su farmacia que se ponga en contacto con nuestra oficina. Nuestro nmero de fax es el [phone].  Si tiene un asunto urgente cuando la clnica est cerrada y que no puede esperar hasta el siguiente da hbil, puede llamar/localizar a su doctor(a) al nmero que aparece a continuacin.   Por favor, tenga en cuenta que aunque hacemos todo lo posible para estar disponibles para asuntos urgentes fuera del horario de oficina, no estamos disponibles las 24 horas del da, los 7 das de la semana.   Si tiene un problema urgente y no puede comunicarse con nosotros, puede optar por buscar atencin mdica  en el consultorio de su doctor(a), en una clnica privada, en un centro de atencin urgente o en una sala de emergencias.  Si tiene una emergencia mdica, por favor llame inmediatamente al 911 o vaya a la sala de emergencias.  Nmeros de bper  - Dr. Kowalski: [phone]  - Dra. Moye: [phone]  - Dra. Stewart: [phone]  En caso de inclemencias del tiempo, por favor llame a nuestra lnea principal al [phone] para una actualizacin sobre el estado de cualquier retraso o cierre.  Consejos para la medicacin en dermatologa: Por favor, guarde las cajas en las que vienen los medicamentos de uso tpico para ayudarle a seguir las instrucciones sobre dnde y cmo usarlos. Las farmacias generalmente imprimen las instrucciones del medicamento slo en las cajas y no directamente en los tubos del medicamento.   Si su medicamento es muy caro, por favor, pngase en contacto con nuestra oficina llamando al [phone] y presione la opcin 4 o envenos un mensaje a travs de MyChart.   No podemos decirle cul ser su copago por los medicamentos por adelantado ya que esto es diferente dependiendo de la cobertura de su seguro. Sin embargo, es posible que podamos encontrar un medicamento sustituto a menor costo o llenar un formulario para que el  seguro cubra el medicamento que se considera necesario.   Si se requiere una autorizacin previa para que su compaa de seguros cubra su medicamento, por favor permtanos de 1 a 2 das hbiles para completar este proceso.  Los precios de los medicamentos varan con frecuencia dependiendo del lugar de dnde se surte la receta y alguna farmacias pueden ofrecer precios ms baratos.  El sitio web www.goodrx.com tiene cupones para medicamentos de diferentes farmacias. Los precios aqu no tienen en cuenta lo que podra costar con la ayuda del seguro (puede ser ms barato con su seguro), pero el sitio web puede darle el precio si no utiliz ningn seguro.  - Puede imprimir el cupn correspondiente y llevarlo con su receta a la farmacia.  - Tambin puede pasar por nuestra oficina durante el horario de atencin regular y recoger una tarjeta de cupones de GoodRx.  - Si necesita que su receta se enve electrnicamente a una farmacia diferente, informe a nuestra oficina a travs de MyChart de [place] [facility]   o por telfono llamando al [phone] y presione la opcin 4.  

## 2022-05-31 ENCOUNTER — Encounter: Payer: Self-pay | Admitting: Dermatology

## 2022-08-31 ENCOUNTER — Telehealth: Payer: Self-pay

## 2022-08-31 NOTE — Telephone Encounter (Signed)
Patient was incorrectly scheduled for botox and filler on a Tuesday morning at 9:30. I have called the number on file who states no one by the name of Cynthia Oliver lives there and emailed patient trying to contact her. aw

## 2022-09-21 ENCOUNTER — Ambulatory Visit (INDEPENDENT_AMBULATORY_CARE_PROVIDER_SITE_OTHER): Payer: Self-pay | Admitting: Dermatology

## 2022-09-21 VITALS — BP 115/65 | HR 66

## 2022-09-21 DIAGNOSIS — L988 Other specified disorders of the skin and subcutaneous tissue: Secondary | ICD-10-CM

## 2022-09-21 NOTE — Patient Instructions (Signed)
Due to recent changes in healthcare laws, you may see results of your pathology and/or laboratory studies on MyChart before the doctors have had a chance to review them. We understand that in some cases there may be results that are confusing or concerning to you. Please understand that not all results are received at the same time and often the doctors may need to interpret multiple results in order to provide you with the best plan of care or course of treatment. Therefore, we ask that you please give us 2 business days to thoroughly review all your results before contacting the office for clarification. Should we see a critical lab result, you will be contacted sooner.   If You Need Anything After Your Visit  If you have any questions or concerns for your doctor, please call our main line at [phone] and press option 4 to reach your doctor's medical assistant. If no one answers, please leave a voicemail as directed and we will return your call as soon as possible. Messages left after 4 pm will be answered the following business day.   You may also send us a message via MyChart. We typically respond to MyChart messages within 1-2 business days.  For prescription refills, please ask your pharmacy to contact our office. Our fax number is [phone].  If you have an urgent issue when the clinic is closed that cannot wait until the next business day, you can page your doctor at the number below.    Please note that while we do our best to be available for urgent issues outside of office hours, we are not available 24/7.   If you have an urgent issue and are unable to reach us, you may choose to seek medical care at your doctor's office, retail clinic, urgent care center, or emergency room.  If you have a medical emergency, please immediately call 911 or go to the emergency department.  Pager Numbers  - Dr. Kowalski: [phone]  - Dr. Moye: [phone]  - Dr. Stewart:  [phone]  In the event of inclement weather, please call our main line at [phone] for an update on the status of any delays or closures.  Dermatology Medication Tips: Please keep the boxes that topical medications come in in order to help keep track of the instructions about where and how to use these. Pharmacies typically print the medication instructions only on the boxes and not directly on the medication tubes.   If your medication is too expensive, please contact our office at [phone] option 4 or send us a message through MyChart.   We are unable to tell what your co-pay for medications will be in advance as this is different depending on your insurance coverage. However, we may be able to find a substitute medication at lower cost or fill out paperwork to get insurance to cover a needed medication.   If a prior authorization is required to get your medication covered by your insurance company, please allow us 1-2 business days to complete this process.  Drug prices often vary depending on where the prescription is filled and some pharmacies may offer cheaper prices.  The website www.goodrx.com contains coupons for medications through different pharmacies. The prices here do not account for what the cost may be with help from insurance (it may be cheaper with your insurance), but the website can give you the price if you did not use any insurance.  - You can print the associated coupon and take it with   your prescription to the pharmacy.  - You may also stop by our office during regular business hours and pick up a GoodRx coupon card.  - If you need your prescription sent electronically to a different pharmacy, notify our office through [facility] MyChart or by phone at [phone] option 4.     Si Usted Necesita Algo Despus de Su Visita  Tambin puede enviarnos un mensaje a travs de MyChart. Por lo general respondemos a los mensajes de MyChart en el transcurso de 1 a 2  das hbiles.  Para renovar recetas, por favor pida a su farmacia que se ponga en contacto con nuestra oficina. Nuestro nmero de fax es el [phone].  Si tiene un asunto urgente cuando la clnica est cerrada y que no puede esperar hasta el siguiente da hbil, puede llamar/localizar a su doctor(a) al nmero que aparece a continuacin.   Por favor, tenga en cuenta que aunque hacemos todo lo posible para estar disponibles para asuntos urgentes fuera del horario de oficina, no estamos disponibles las 24 horas del da, los 7 das de la semana.   Si tiene un problema urgente y no puede comunicarse con nosotros, puede optar por buscar atencin mdica  en el consultorio de su doctor(a), en una clnica privada, en un centro de atencin urgente o en una sala de emergencias.  Si tiene una emergencia mdica, por favor llame inmediatamente al 911 o vaya a la sala de emergencias.  Nmeros de bper  - Dr. Kowalski: [phone]  - Dra. Moye: [phone]  - Dra. Stewart: [phone]  En caso de inclemencias del tiempo, por favor llame a nuestra lnea principal al [phone] para una actualizacin sobre el estado de cualquier retraso o cierre.  Consejos para la medicacin en dermatologa: Por favor, guarde las cajas en las que vienen los medicamentos de uso tpico para ayudarle a seguir las instrucciones sobre dnde y cmo usarlos. Las farmacias generalmente imprimen las instrucciones del medicamento slo en las cajas y no directamente en los tubos del medicamento.   Si su medicamento es muy caro, por favor, pngase en contacto con nuestra oficina llamando al [phone] y presione la opcin 4 o envenos un mensaje a travs de MyChart.   No podemos decirle cul ser su copago por los medicamentos por adelantado ya que esto es diferente dependiendo de la cobertura de su seguro. Sin embargo, es posible que podamos encontrar un medicamento sustituto a menor costo o llenar un formulario para que el  seguro cubra el medicamento que se considera necesario.   Si se requiere una autorizacin previa para que su compaa de seguros cubra su medicamento, por favor permtanos de 1 a 2 das hbiles para completar este proceso.  Los precios de los medicamentos varan con frecuencia dependiendo del lugar de dnde se surte la receta y alguna farmacias pueden ofrecer precios ms baratos.  El sitio web www.goodrx.com tiene cupones para medicamentos de diferentes farmacias. Los precios aqu no tienen en cuenta lo que podra costar con la ayuda del seguro (puede ser ms barato con su seguro), pero el sitio web puede darle el precio si no utiliz ningn seguro.  - Puede imprimir el cupn correspondiente y llevarlo con su receta a la farmacia.  - Tambin puede pasar por nuestra oficina durante el horario de atencin regular y recoger una tarjeta de cupones de GoodRx.  - Si necesita que su receta se enve electrnicamente a una farmacia diferente, informe a nuestra oficina a travs de MyChart de [place]   o por telfono llamando al [phone] y presione la opcin 4.  

## 2022-09-21 NOTE — Progress Notes (Signed)
   Follow-Up Visit   Subjective  Cynthia Oliver is a 57 y.o. 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  The following portions of the chart were reviewed this encounter and updated as appropriate:  Allergies  Meds  Problems  Med Hx  Surg Hx  Fam Hx     Review of Systems: No other skin or systemic complaints except as noted in HPI or Assessment and Plan.  Objective  Well appearing patient in no apparent distress; mood and affect are within normal limits.  A focused examination was performed including face. Relevant physical exam findings are noted in the Assessment and Plan.  Head - Anterior (Face) Rhytides and volume loss.    Assessment & Plan  Elastosis of skin Head - Anterior (Face)  Botox 30 units injected as marked:  - Frown complex 20 units - Crow's feet 5 units each for a total of 10 units  Botox Injection - Head - Anterior (Face) Location: See attached image  Informed consent: Discussed risks (infection, pain, bleeding, bruising, swelling, allergic reaction, paralysis of nearby muscles, eyelid droop, double vision, neck weakness, difficulty breathing, headache, undesirable cosmetic result, and need for additional treatment) and benefits of the procedure, as well as the alternatives.  Informed consent was obtained.  Preparation: The area was cleansed with alcohol.  Procedure Details:  Botox was injected into the dermis with a 30-gauge needle. Pressure applied to any bleeding. Ice packs offered for swelling.  Lot Number:  [id number] Expiration:  09/2024  Total Units Injected:  30  Plan: Patient was instructed to remain upright for 4 hours. Patient was instructed to avoid massaging the face and avoid vigorous exercise for the rest of the day. Tylenol may be used for headache.  Allow 2 weeks before returning to clinic for additional dosing as needed. Patient will call for any problems.    Return for Botox in 3-4 months.  I, Emelia Salisbury, CMA, am acting as scribe for Sarina Ser, MD. Documentation: I have reviewed the above documentation for accuracy and completeness, and I agree with the above.  Sarina Ser, MD

## 2022-09-26 ENCOUNTER — Encounter: Payer: Self-pay | Admitting: Dermatology

## 2023-01-25 ENCOUNTER — Encounter: Payer: Self-pay | Admitting: Dermatology

## 2023-01-25 ENCOUNTER — Ambulatory Visit (INDEPENDENT_AMBULATORY_CARE_PROVIDER_SITE_OTHER): Payer: Self-pay | Admitting: Dermatology

## 2023-01-25 DIAGNOSIS — L988 Other specified disorders of the skin and subcutaneous tissue: Secondary | ICD-10-CM

## 2023-01-25 NOTE — Progress Notes (Signed)
   Follow-Up Visit   Subjective  Cynthia Oliver is a 57 y.o. female who presents for the following: Botox for facial elastosis  The following portions of the chart were reviewed this encounter and updated as appropriate: medications, allergies, medical history  Review of Systems:  No other skin or systemic complaints except as noted in HPI or Assessment and Plan.  Objective  Well appearing patient in no apparent distress; mood and affect are within normal limits.  A focused examination was performed of the face.  Relevant physical exam findings are noted in the Assessment and Plan.      Assessment & Plan   Elastosis of skin   Facial Elastosis  Location: See attached image  Informed consent: Discussed risks (infection, pain, bleeding, bruising, swelling, allergic reaction, paralysis of nearby muscles, eyelid droop, double vision, neck weakness, difficulty breathing, headache, undesirable cosmetic result, and need for additional treatment) and benefits of the procedure, as well as the alternatives.  Informed consent was obtained.  Preparation: The area was cleansed with alcohol.  Procedure Details:  Botox was injected into the dermis with a 30-gauge needle. Pressure applied to any bleeding. Ice packs offered for swelling.  Lot Number:  [id number] Expiration:  06/26  Total Units Injected:  30  Plan: Tylenol may be used for headache.  Allow 2 weeks before returning to clinic for additional dosing as needed. Patient will call for any problems.  Return in about 4 months (around 05/27/2023) for Botox injections.  Maylene Roes, CMA, am acting as scribe for Armida Sans, MD .  Documentation: I have reviewed the above documentation for accuracy and completeness, and I agree with the above.  Armida Sans, MD

## 2023-01-25 NOTE — Patient Instructions (Signed)
Due to recent changes in healthcare laws, you may see results of your pathology and/or laboratory studies on MyChart before the doctors have had a chance to review them. We understand that in some cases there may be results that are confusing or concerning to you. Please understand that not all results are received at the same time and often the doctors may need to interpret multiple results in order to provide you with the best plan of care or course of treatment. Therefore, we ask that you please give us 2 business days to thoroughly review all your results before contacting the office for clarification. Should we see a critical lab result, you will be contacted sooner.   If You Need Anything After Your Visit  If you have any questions or concerns for your doctor, please call our main line at [phone] and press option 4 to reach your doctor's medical assistant. If no one answers, please leave a voicemail as directed and we will return your call as soon as possible. Messages left after 4 pm will be answered the following business day.   You may also send us a message via MyChart. We typically respond to MyChart messages within 1-2 business days.  For prescription refills, please ask your pharmacy to contact our office. Our fax number is [phone].  If you have an urgent issue when the clinic is closed that cannot wait until the next business day, you can page your doctor at the number below.    Please note that while we do our best to be available for urgent issues outside of office hours, we are not available 24/7.   If you have an urgent issue and are unable to reach us, you may choose to seek medical care at your doctor's office, retail clinic, urgent care center, or emergency room.  If you have a medical emergency, please immediately call 911 or go to the emergency department.  Pager Numbers  - Dr. Kowalski: [phone]  - Dr. Moye: [phone]  - Dr. Stewart:  [phone]  In the event of inclement weather, please call our main line at [phone] for an update on the status of any delays or closures.  Dermatology Medication Tips: Please keep the boxes that topical medications come in in order to help keep track of the instructions about where and how to use these. Pharmacies typically print the medication instructions only on the boxes and not directly on the medication tubes.   If your medication is too expensive, please contact our office at [phone] option 4 or send us a message through MyChart.   We are unable to tell what your co-pay for medications will be in advance as this is different depending on your insurance coverage. However, we may be able to find a substitute medication at lower cost or fill out paperwork to get insurance to cover a needed medication.   If a prior authorization is required to get your medication covered by your insurance company, please allow us 1-2 business days to complete this process.  Drug prices often vary depending on where the prescription is filled and some pharmacies may offer cheaper prices.  The website www.goodrx.com contains coupons for medications through different pharmacies. The prices here do not account for what the cost may be with help from insurance (it may be cheaper with your insurance), but the website can give you the price if you did not use any insurance.  - You can print the associated coupon and take it with   your prescription to the pharmacy.  - You may also stop by our office during regular business hours and pick up a GoodRx coupon card.  - If you need your prescription sent electronically to a different pharmacy, notify our office through [facility] MyChart or by phone at [phone] option 4.     Si Usted Necesita Algo Despus de Su Visita  Tambin puede enviarnos un mensaje a travs de MyChart. Por lo general respondemos a los mensajes de MyChart en el transcurso de 1 a 2  das hbiles.  Para renovar recetas, por favor pida a su farmacia que se ponga en contacto con nuestra oficina. Nuestro nmero de fax es el [phone].  Si tiene un asunto urgente cuando la clnica est cerrada y que no puede esperar hasta el siguiente da hbil, puede llamar/localizar a su doctor(a) al nmero que aparece a continuacin.   Por favor, tenga en cuenta que aunque hacemos todo lo posible para estar disponibles para asuntos urgentes fuera del horario de oficina, no estamos disponibles las 24 horas del da, los 7 das de la semana.   Si tiene un problema urgente y no puede comunicarse con nosotros, puede optar por buscar atencin mdica  en el consultorio de su doctor(a), en una clnica privada, en un centro de atencin urgente o en una sala de emergencias.  Si tiene una emergencia mdica, por favor llame inmediatamente al 911 o vaya a la sala de emergencias.  Nmeros de bper  - Dr. Kowalski: [phone]  - Dra. Moye: [phone]  - Dra. Stewart: [phone]  En caso de inclemencias del tiempo, por favor llame a nuestra lnea principal al [phone] para una actualizacin sobre el estado de cualquier retraso o cierre.  Consejos para la medicacin en dermatologa: Por favor, guarde las cajas en las que vienen los medicamentos de uso tpico para ayudarle a seguir las instrucciones sobre dnde y cmo usarlos. Las farmacias generalmente imprimen las instrucciones del medicamento slo en las cajas y no directamente en los tubos del medicamento.   Si su medicamento es muy caro, por favor, pngase en contacto con nuestra oficina llamando al [phone] y presione la opcin 4 o envenos un mensaje a travs de MyChart.   No podemos decirle cul ser su copago por los medicamentos por adelantado ya que esto es diferente dependiendo de la cobertura de su seguro. Sin embargo, es posible que podamos encontrar un medicamento sustituto a menor costo o llenar un formulario para que el  seguro cubra el medicamento que se considera necesario.   Si se requiere una autorizacin previa para que su compaa de seguros cubra su medicamento, por favor permtanos de 1 a 2 das hbiles para completar este proceso.  Los precios de los medicamentos varan con frecuencia dependiendo del lugar de dnde se surte la receta y alguna farmacias pueden ofrecer precios ms baratos.  El sitio web www.goodrx.com tiene cupones para medicamentos de diferentes farmacias. Los precios aqu no tienen en cuenta lo que podra costar con la ayuda del seguro (puede ser ms barato con su seguro), pero el sitio web puede darle el precio si no utiliz ningn seguro.  - Puede imprimir el cupn correspondiente y llevarlo con su receta a la farmacia.  - Tambin puede pasar por nuestra oficina durante el horario de atencin regular y recoger una tarjeta de cupones de GoodRx.  - Si necesita que su receta se enve electrnicamente a una farmacia diferente, informe a nuestra oficina a travs de MyChart de    o por telfono llamando al [phone] y presione la opcin 4.  

## 2023-04-26 ENCOUNTER — Ambulatory Visit (INDEPENDENT_AMBULATORY_CARE_PROVIDER_SITE_OTHER): Payer: Self-pay | Admitting: Dermatology

## 2023-04-26 VITALS — BP 105/71 | HR 71

## 2023-04-26 DIAGNOSIS — Z7189 Other specified counseling: Secondary | ICD-10-CM

## 2023-04-26 DIAGNOSIS — Z79899 Other long term (current) drug therapy: Secondary | ICD-10-CM

## 2023-04-26 DIAGNOSIS — L811 Chloasma: Secondary | ICD-10-CM

## 2023-04-26 DIAGNOSIS — L988 Other specified disorders of the skin and subcutaneous tissue: Secondary | ICD-10-CM

## 2023-04-26 NOTE — Progress Notes (Unsigned)
   Follow-Up Visit   Subjective  Cynthia Oliver is a 57 y.o. female who presents for the following: Botox for facial elastosis And check for fillers Also wants to discuss melasma and treatment.  The following portions of the chart were reviewed this encounter and updated as appropriate: medications, allergies, medical history  Review of Systems:  No other skin or systemic complaints except as noted in HPI or Assessment and Plan.  Objective  Well appearing patient in no apparent distress; mood and affect are within normal limits.  A focused examination was performed of the face.  Relevant physical exam findings are noted in the Assessment and Plan.   Injection map photo      Melasma Photos         Assessment & Plan   Elastosis of skin  Melasma   Facial Elastosis  Location: See attached image  Informed consent: Discussed risks (infection, pain, bleeding, bruising, swelling, allergic reaction, paralysis of nearby muscles, eyelid droop, double vision, neck weakness, difficulty breathing, headache, undesirable cosmetic result, and need for additional treatment) and benefits of the procedure, as well as the alternatives.  Informed consent was obtained.  Preparation: The area was cleansed with alcohol.  Procedure Details:  Botox was injected into the dermis with a 30-gauge needle. Pressure applied to any bleeding. Ice packs offered for swelling.  Lot Number:  [id number]  Expiration:  04/02/2025  Total Units Injected:  30  Plan: Tylenol may be used for headache.  Allow 2 weeks before returning to clinic for additional dosing as needed. Patient will call for any problems.  Melasma Face  Exam:  Reticulated hyperpigmented patches of face see photos above  Chronic and persistent condition with duration or expected duration over one year. Condition is improving with treatment but not currently at goal.  Melasma is a chronic condition of persistent pigmented patches  generally on the face, worse in summer due to higher UV exposure.  Oral estrogen containing BCPs or supplements can exacerbate condition.  Recommend daily broad spectrum tinted sunscreen SPF 30+ to face, preferably with Zinc or Titanium Dioxide. Discussed Rx topical bleaching creams (i.e. hydroquinone), OTC HelioCare supplement, chemical peels (would need multiple for best result).    Treatment: Re-start : Hydroquinone: 8%, Tretinoin: 0.025%, Kojic Acid: 1%, Niacinamide: 4%, Fluocinolone: 0.025% cream at bedtime up to 3 months. 2 rfs   Instructions for [facility] Medications One or more of your medications was sent to the [facility] mail order compounding pharmacy. You will receive an email from them and can purchase the medicine through that link. It will then be mailed to your home at the address you confirmed. If for any reason you do not receive an email from them, please check your spam folder. If you still do not find the email, please let us know. [facility] phone number is [phone].   Return for 3 - 4 month botox and filler  follow up.  IAsher Muir, CMA, am acting as scribe for Armida Sans, MD.   Documentation: I have reviewed the above documentation for accuracy and completeness, and I agree with the above.  Armida Sans, MD

## 2023-04-26 NOTE — Patient Instructions (Addendum)
Instructions for [facility] Medications  One or more of your medications was sent to the [facility] mail order compounding pharmacy. You will receive an email from them and can purchase the medicine through that link. It will then be mailed to your home at the address you confirmed. If for any reason you do not receive an email from them, please check your spam folder. If you still do not find the email, please let us know. [facility] phone number is [phone].       Due to recent changes in healthcare laws, you may see results of your pathology and/or laboratory studies on MyChart before the doctors have had a chance to review them. We understand that in some cases there may be results that are confusing or concerning to you. Please understand that not all results are received at the same time and often the doctors may need to interpret multiple results in order to provide you with the best plan of care or course of treatment. Therefore, we ask that you please give [place] 2 business days to thoroughly review all your results before contacting the office for clarification. Should we see a critical lab result, you will be contacted sooner.   If You Need Anything After Your Visit  If you have any questions or concerns for your doctor, please call our main line at [phone] and press option 4 to reach your doctor's medical assistant. If no one answers, please leave a voicemail as directed and we will return your call as soon as possible. Messages left after 4 pm will be answered the following business day.   You may also send [place] a message via MyChart. We typically respond to MyChart messages within 1-2 business days.  For prescription refills, please ask your pharmacy to contact our office. Our fax number is [phone].  If you have an urgent issue when the clinic is closed that cannot wait until the next business day, you can page your doctor at the number below.    Please note  that while we do our best to be available for urgent issues outside of office hours, we are not available 24/7.   If you have an urgent issue and are unable to reach [place], you may choose to seek medical care at your doctor's office, retail clinic, urgent care center, or emergency room.  If you have a medical emergency, please immediately call 911 or go to the emergency department.  Pager Numbers  - Dr. Gwen Pounds: [phone]  - Dr. Roseanne Reno: [phone]  - Dr. Katrinka Blazing: [phone]   In the event of inclement weather, please call our main line at [phone] for an update on the status of any delays or closures.  Dermatology Medication Tips: Please keep the boxes that topical medications come in in order to help keep track of the instructions about where and how to use these. Pharmacies typically print the medication instructions only on the boxes and not directly on the medication tubes.   If your medication is too expensive, please contact our office at [phone] option 4 or send [place] a message through MyChart.   We are unable to tell what your co-pay for medications will be in advance as this is different depending on your insurance coverage. However, we may be able to find a substitute medication at lower cost or fill out paperwork to get insurance to cover a needed medication.   If a prior authorization is required to get your medication covered by your  insurance company, please allow [place] 1-2 business days to complete this process.  Drug prices often vary depending on where the prescription is filled and some pharmacies may offer cheaper prices.  The website www.goodrx.com contains coupons for medications through different pharmacies. The prices here do not account for what the cost may be with help from insurance (it may be cheaper with your insurance), but the website can give you the price if you did not use any insurance.  - You can print the associated coupon and take it with your  prescription to the pharmacy.  - You may also stop by our office during regular business hours and pick up a GoodRx coupon card.  - If you need your prescription sent electronically to a different pharmacy, notify our office through [facility] or by phone at [phone] option 4.     Si Usted Necesita Algo Despus de Su Visita  Tambin puede enviarnos un mensaje a travs de Clinical cytogeneticist. Por lo general respondemos a los mensajes de MyChart en el transcurso de 1 a 2 das hbiles.  Para renovar recetas, por favor pida a su farmacia que se ponga en contacto con nuestra oficina. Annie Sable de fax es [place] [phone].  Si tiene un asunto urgente cuando la clnica est cerrada y que no puede esperar hasta el siguiente da hbil, puede llamar/localizar a su doctor(a) al nmero que aparece a continuacin.   Por favor, tenga en cuenta que aunque hacemos todo lo posible para estar disponibles para asuntos urgentes fuera del horario de [place], no estamos disponibles las 24 horas del da, los 7 [address]  [address] de la [place].   Si tiene un problema urgente y no puede comunicarse con nosotros, puede optar por buscar atencin mdica  en el consultorio de su doctor(a), en una clnica privada, en un centro de atencin urgente o en una sala de emergencias.  Si tiene Engineer, drilling, por favor llame inmediatamente al 911 o vaya a la sala de emergencias.  Nmeros de bper  - Dr. Gwen Pounds: [phone]  - Dra. Roseanne Reno: [phone]  - Dr. Katrinka Blazing: [phone]   En caso de inclemencias del tiempo, por favor llame a Lacy Duverney principal al [phone] para una actualizacin sobre el [place] de cualquier retraso o cierre.  Consejos para la medicacin en dermatologa: Por favor, guarde las cajas en las que vienen los medicamentos de uso tpico para ayudarle a seguir las instrucciones sobre dnde y cmo usarlos. Las farmacias generalmente imprimen las instrucciones del medicamento slo en las cajas y no  directamente en los tubos del [place].   Si su medicamento es muy caro, por favor, pngase en contacto con Rolm Gala llamando al [phone] y presione la opcin 4 o envenos un mensaje a travs de Clinical cytogeneticist.   No podemos decirle cul ser su copago por los medicamentos por adelantado ya que esto es diferente dependiendo de la cobertura de su seguro. Sin embargo, es posible que podamos encontrar un medicamento sustituto a Audiological scientist un formulario para que el seguro cubra el medicamento que se considera necesario.   Si se requiere una autorizacin previa para que su compaa de seguros [place] su medicamento, por favor permtanos de 1 a 2 das hbiles para completar [address]  Los precios de los medicamentos varan con frecuencia dependiendo del Environmental consultant de dnde se surte la receta y alguna farmacias pueden ofrecer precios ms baratos.  El sitio web www.goodrx.com tiene cupones para medicamentos de Health and safety inspector. Los precios aqu no tienen  en cuenta lo que podra costar con la ayuda del seguro (puede ser ms barato con su seguro), pero el sitio web puede darle el precio si no Visual merchandiser.  - Puede imprimir el cupn correspondiente y llevarlo con su receta a la farmacia.  - Tambin puede pasar por nuestra oficina durante el horario de atencin regular y Education officer, museum una tarjeta de cupones de GoodRx.  - Si necesita que su receta se enve electrnicamente a una farmacia diferente, informe a nuestra oficina a travs de MyChart de [place] o por telfono llamando al [phone] y presione la opcin 4.

## 2023-04-27 ENCOUNTER — Encounter: Payer: Self-pay | Admitting: Dermatology

## 2023-09-20 ENCOUNTER — Encounter: Payer: Self-pay | Admitting: Dermatology

## 2023-09-20 ENCOUNTER — Ambulatory Visit (INDEPENDENT_AMBULATORY_CARE_PROVIDER_SITE_OTHER): Payer: Self-pay | Admitting: Dermatology

## 2023-09-20 DIAGNOSIS — L988 Other specified disorders of the skin and subcutaneous tissue: Secondary | ICD-10-CM

## 2023-09-20 NOTE — Patient Instructions (Addendum)
 Recommend Alastin A- Aluminate Brightening Serum    Due to recent changes in healthcare laws, you may see results of your pathology and/or laboratory studies on MyChart before the doctors have had a chance to review them. We understand that in some cases there may be results that are confusing or concerning to you. Please understand that not all results are received at the same time and often the doctors may need to interpret multiple results in order to provide you with the best plan of care or course of treatment. Therefore, we ask that you please give [place] 2 business days to thoroughly review all your results before contacting the office for clarification. Should we see a critical lab result, you will be contacted sooner.   If You Need Anything After Your Visit  If you have any questions or concerns for your doctor, please call our main line at [phone] and press option 4 to reach your doctor's medical assistant. If no one answers, please leave a voicemail as directed and we will return your call as soon as possible. Messages left after 4 pm will be answered the following business day.   You may also send [place] a message via MyChart. We typically respond to MyChart messages within 1-2 business days.  For prescription refills, please ask your pharmacy to contact our office. Our fax number is [phone].  If you have an urgent issue when the clinic is closed that cannot wait until the next business day, you can page your doctor at the number below.    Please note that while we do our best to be available for urgent issues outside of office hours, we are not available 24/7.   If you have an urgent issue and are unable to reach [place], you may choose to seek medical care at your doctor's office, retail clinic, urgent care center, or emergency room.  If you have a medical emergency, please immediately call 911 or go to the emergency department.  Pager Numbers  - Dr. Gwen Pounds: [phone]  -  Dr. Roseanne Reno: [phone]  - Dr. Katrinka Blazing: [phone]   In the event of inclement weather, please call our main line at [phone] for an update on the status of any delays or closures.  Dermatology Medication Tips: Please keep the boxes that topical medications come in in order to help keep track of the instructions about where and how to use these. Pharmacies typically print the medication instructions only on the boxes and not directly on the medication tubes.   If your medication is too expensive, please contact our office at [phone] option 4 or send [place] a message through MyChart.   We are unable to tell what your co-pay for medications will be in advance as this is different depending on your insurance coverage. However, we may be able to find a substitute medication at lower cost or fill out paperwork to get insurance to cover a needed medication.   If a prior authorization is required to get your medication covered by your insurance company, please allow [place] 1-2 business days to complete this process.  Drug prices often vary depending on where the prescription is filled and some pharmacies may offer cheaper prices.  The website www.goodrx.com contains coupons for medications through different pharmacies. The prices here do not account for what the cost may be with help from insurance (it may be cheaper with your insurance), but the website can give you the price if you did not use any insurance.  -  You can print the associated coupon and take it with your prescription to the pharmacy.  - You may also stop by our office during regular business hours and pick up a GoodRx coupon card.  - If you need your prescription sent electronically to a different pharmacy, notify our office through [facility] or by phone at [phone] option 4.     Si Usted Necesita Algo Despus de Su Visita  Tambin puede enviarnos un mensaje a travs de Clinical cytogeneticist. Por lo general respondemos a los  mensajes de MyChart en el transcurso de 1 a 2 das hbiles.  Para renovar recetas, por favor pida a su farmacia que se ponga en contacto con nuestra oficina. Annie Sable de fax es [place] [phone].  Si tiene un asunto urgente cuando la clnica est cerrada y que no puede esperar hasta el siguiente da hbil, puede llamar/localizar a su doctor(a) al nmero que aparece a continuacin.   Por favor, tenga en cuenta que aunque hacemos todo lo posible para estar disponibles para asuntos urgentes fuera del horario de [place], no estamos disponibles las 24 horas del da, los 7 [address]  [address] de la [place].   Si tiene un problema urgente y no puede comunicarse con nosotros, puede optar por buscar atencin mdica  en el consultorio de su doctor(a), en una clnica privada, en un centro de atencin urgente o en una sala de emergencias.  Si tiene Engineer, drilling, por favor llame inmediatamente al 911 o vaya a la sala de emergencias.  Nmeros de bper  - Dr. Gwen Pounds: [phone]  - Dra. Roseanne Reno: [phone]  - Dr. Katrinka Blazing: [phone]   En caso de inclemencias del tiempo, por favor llame a Lacy Duverney principal al [phone] para una actualizacin sobre el [place] de cualquier retraso o cierre.  Consejos para la medicacin en dermatologa: Por favor, guarde las cajas en las que vienen los medicamentos de uso tpico para ayudarle a seguir las instrucciones sobre dnde y cmo usarlos. Las farmacias generalmente imprimen las instrucciones del medicamento slo en las cajas y no directamente en los tubos del [place].   Si su medicamento es muy caro, por favor, pngase en contacto con Rolm Gala llamando al [phone] y presione la opcin 4 o envenos un mensaje a travs de Clinical cytogeneticist.   No podemos decirle cul ser su copago por los medicamentos por adelantado ya que esto es diferente dependiendo de la cobertura de su seguro. Sin embargo, es posible que podamos encontrar un medicamento sustituto a  Audiological scientist un formulario para que el seguro cubra el medicamento que se considera necesario.   Si se requiere una autorizacin previa para que su compaa de seguros [place] su medicamento, por favor permtanos de 1 a 2 das hbiles para completar [address]  Los precios de los medicamentos varan con frecuencia dependiendo del Environmental consultant de dnde se surte la receta y alguna farmacias pueden ofrecer precios ms baratos.  El sitio web www.goodrx.com tiene cupones para medicamentos de Health and safety inspector. Los precios aqu no tienen en cuenta lo que podra costar con la ayuda del seguro (puede ser ms barato con su seguro), pero el sitio web puede darle el precio si no utiliz Tourist information centre manager.  - Puede imprimir el cupn correspondiente y llevarlo con su receta a la farmacia.  - Tambin puede pasar por nuestra oficina durante el horario de atencin regular y Education officer, museum una tarjeta de cupones de GoodRx.  - Si necesita que su receta se enve electrnicamente a [place] [employer],  informe a nuestra oficina a travs de MyChart de [place] o por telfono llamando al [phone] y presione la opcin 4.

## 2023-09-20 NOTE — Progress Notes (Signed)
   Follow-Up Visit   Subjective  Cynthia Oliver is a 58 y.o. female who presents for the following: Botox for facial elastosis and filler consult.   The following portions of the chart were reviewed this encounter and updated as appropriate: medications, allergies, medical history  Review of Systems:  No other skin or systemic complaints except as noted in HPI or Assessment and Plan.  Objective  Well appearing patient in no apparent distress; mood and affect are within normal limits.  A focused examination was performed of the face.  Relevant physical exam findings are noted in the Assessment and Plan.     Assessment & Plan   ELASTOSIS OF SKIN   Facial Elastosis  Location: See attached image  Informed consent: Discussed risks (infection, pain, bleeding, bruising, swelling, allergic reaction, paralysis of nearby muscles, eyelid droop, double vision, neck weakness, difficulty breathing, headache, undesirable cosmetic result, and need for additional treatment) and benefits of the procedure, as well as the alternatives.  Informed consent was obtained.  Preparation: The area was cleansed with alcohol.  Procedure Details:  Botox was injected into the dermis with a 30-gauge needle. Pressure applied to any bleeding. Ice packs offered for swelling.  30 units injected  20 units at frown complex  5 units x 2 for Crow's Feet   Lot Number:  [id number] Expiration:  06/2025  Total Units Injected:  30  Plan: Tylenol may be used for headache.  Allow 2 weeks before returning to clinic for additional dosing as needed. Patient will call for any problems.  Patient evaluated for fillers, do not recommend at this time Discussed that since Dr Kirtland Bouchard not doing fillers due to [facility] surgery, Dr. Roseanne Reno or new [place] coming in August 2025 may  be available when filler is needed.   Recommend Alastin A- Aluminate Brightening Serum     Return for 3 - 4 month botox .  IAsher Muir, CMA, am  acting as scribe for Armida Sans, MD.   Documentation: I have reviewed the above documentation for accuracy and completeness, and I agree with the above.  Armida Sans, MD

## 2024-01-19 ENCOUNTER — Ambulatory Visit (INDEPENDENT_AMBULATORY_CARE_PROVIDER_SITE_OTHER): Payer: Self-pay | Admitting: Dermatology

## 2024-01-19 ENCOUNTER — Encounter: Payer: Self-pay | Admitting: Dermatology

## 2024-01-19 DIAGNOSIS — L988 Other specified disorders of the skin and subcutaneous tissue: Secondary | ICD-10-CM

## 2024-01-19 NOTE — Patient Instructions (Addendum)
 Counseling for BBL / IPL / Laser and Coordination of Care Discussed the treatment option of Broad Band Light (BBL) /Intense Pulsed Light (IPL)/ Laser for skin discoloration, including brown spots and redness.  Typically we recommend at least 1-3 treatment sessions about 5-8 weeks apart for best results.  Cannot have tanned skin when BBL performed, and regular use of sunscreen/photoprotection is advised after the procedure to help maintain results. The patient's condition may also require "maintenance treatments" in the future.  The fee for BBL / laser treatments is $350 per treatment session for the whole face.  A fee can be quoted for other parts of the body.  Insurance typically does not pay for BBL/laser treatments and therefore the fee is an out-of-pocket cost. Recommend prophylactic valtrex treatment. Once scheduled for procedure, will send Rx in prior to patient's appointment.     Due to recent changes in healthcare laws, you may see results of your pathology and/or laboratory studies on MyChart before the doctors have had a chance to review them. We understand that in some cases there may be results that are confusing or concerning to you. Please understand that not all results are received at the same time and often the doctors may need to interpret multiple results in order to provide you with the best plan of care or course of treatment. Therefore, we ask that you please give [place] 2 business days to thoroughly review all your results before contacting the office for clarification. Should we see a critical lab result, you will be contacted sooner.   If You Need Anything After Your Visit  If you have any questions or concerns for your doctor, please call our main line at [phone] and press option 4 to reach your doctor's medical assistant. If no one answers, please leave a voicemail as directed and we will return your call as soon as possible. Messages left after 4 pm will be answered the  following business day.   You may also send [place] a message via MyChart. We typically respond to MyChart messages within 1-2 business days.  For prescription refills, please ask your pharmacy to contact our office. Our fax number is [phone].  If you have an urgent issue when the clinic is closed that cannot wait until the next business day, you can page your doctor at the number below.    Please note that while we do our best to be available for urgent issues outside of office hours, we are not available 24/7.   If you have an urgent issue and are unable to reach [place], you may choose to seek medical care at your doctor's office, retail clinic, urgent care center, or emergency room.  If you have a medical emergency, please immediately call 911 or go to the emergency department.  Pager Numbers  - Dr. Gwen Pounds: [phone]  - Dr. Roseanne Reno: [phone]  - Dr. Katrinka Blazing: [phone]   In the event of inclement weather, please call our main line at [phone] for an update on the status of any delays or closures.  Dermatology Medication Tips: Please keep the boxes that topical medications come in in order to help keep track of the instructions about where and how to use these. Pharmacies typically print the medication instructions only on the boxes and not directly on the medication tubes.   If your medication is too expensive, please contact our office at [phone] option 4 or send [place] a message through MyChart.   We are unable to tell what  your co-pay for medications will be in advance as this is different depending on your insurance coverage. However, we may be able to find a substitute medication at lower cost or fill out paperwork to get insurance to cover a needed medication.   If a prior authorization is required to get your medication covered by your insurance company, please allow [place] 1-2 business days to complete this process.  Drug prices often vary depending on where the  prescription is filled and some pharmacies may offer cheaper prices.  The website www.goodrx.com contains coupons for medications through different pharmacies. The prices here do not account for what the cost may be with help from insurance (it may be cheaper with your insurance), but the website can give you the price if you did not use any insurance.  - You can print the associated coupon and take it with your prescription to the pharmacy.  - You may also stop by our office during regular business hours and pick up a GoodRx coupon card.  - If you need your prescription sent electronically to a different pharmacy, notify our office through [facility] or by phone at [phone] option 4.     Si Usted Necesita Algo Despus de Su Visita  Tambin puede enviarnos un mensaje a travs de Clinical cytogeneticist. Por lo general respondemos a los mensajes de MyChart en el transcurso de 1 a 2 das hbiles.  Para renovar recetas, por favor pida a su farmacia que se ponga en contacto con nuestra oficina. Annie Sable de fax es [place] [phone].  Si tiene un asunto urgente cuando la clnica est cerrada y que no puede esperar hasta el siguiente da hbil, puede llamar/localizar a su doctor(a) al nmero que aparece a continuacin.   Por favor, tenga en cuenta que aunque hacemos todo lo posible para estar disponibles para asuntos urgentes fuera del horario de [place], no estamos disponibles las 24 horas del da, los 7 [address]  [address] de la [place].   Si tiene un problema urgente y no puede comunicarse con nosotros, puede optar por buscar atencin mdica  en el consultorio de su doctor(a), en una clnica privada, en un centro de atencin urgente o en una sala de emergencias.  Si tiene Engineer, drilling, por favor llame inmediatamente al 911 o vaya a la sala de emergencias.  Nmeros de bper  - Dr. Gwen Pounds: [phone]  - Dra. Roseanne Reno: [phone]  - Dr. Katrinka Blazing: [phone]   En caso de inclemencias del  tiempo, por favor llame a Lacy Duverney principal al [phone] para una actualizacin sobre el [place] de cualquier retraso o cierre.  Consejos para la medicacin en dermatologa: Por favor, guarde las cajas en las que vienen los medicamentos de uso tpico para ayudarle a seguir las instrucciones sobre dnde y cmo usarlos. Las farmacias generalmente imprimen las instrucciones del medicamento slo en las cajas y no directamente en los tubos del [place].   Si su medicamento es muy caro, por favor, pngase en contacto con Rolm Gala llamando al [phone] y presione la opcin 4 o envenos un mensaje a travs de Clinical cytogeneticist.   No podemos decirle cul ser su copago por los medicamentos por adelantado ya que esto es diferente dependiendo de la cobertura de su seguro. Sin embargo, es posible que podamos encontrar un medicamento sustituto a Audiological scientist un formulario para que el seguro cubra el medicamento que se considera necesario.   Si se requiere una autorizacin previa para que su compaa de seguros [place] su  medicamento, por favor permtanos de 1 a 2 das hbiles para completar [address]  Los precios de los medicamentos varan con frecuencia dependiendo del Environmental consultant de dnde se surte la receta y alguna farmacias pueden ofrecer precios ms baratos.  El sitio web www.goodrx.com tiene cupones para medicamentos de Health and safety inspector. Los precios aqu no tienen en cuenta lo que podra costar con la ayuda del seguro (puede ser ms barato con su seguro), pero el sitio web puede darle el precio si no utiliz Tourist information centre manager.  - Puede imprimir el cupn correspondiente y llevarlo con su receta a la farmacia.  - Tambin puede pasar por nuestra oficina durante el horario de atencin regular y Education officer, museum una tarjeta de cupones de GoodRx.  - Si necesita que su receta se enve electrnicamente a una farmacia diferente, informe a nuestra oficina a travs de MyChart de [place] o por telfono  llamando al [phone] y presione la opcin 4.

## 2024-01-19 NOTE — Progress Notes (Unsigned)
   Follow-Up Visit   Subjective  Cynthia Oliver is a 58 y.o. female who presents for the following: Botox for facial elastosis  The following portions of the chart were reviewed this encounter and updated as appropriate: medications, allergies, medical history  Daughter is with patient and contributes to history.   Review of Systems:  No other skin or systemic complaints except as noted in HPI or Assessment and Plan.  Objective  Well appearing patient in no apparent distress; mood and affect are within normal limits.  A focused examination was performed of the face.  Relevant physical exam findings are noted in the Assessment and Plan.  Injection map photo     Assessment & Plan   FACIAL ELASTOSIS Exam: Rhytides and volume loss.  Treatment Plan: Recommend 1 syringe Restylane Refyne to oral commissures and chin crease.  Will RTC with Dr. Annette Barters for fillers.   Recommend daily broad spectrum sunscreen SPF 30+ to sun-exposed areas, reapply every 2 hours as needed. Call for new or changing lesions.  Staying in the shade or wearing long sleeves, sun glasses (UVA+UVB protection) and wide brim hats (4-inch brim around the entire circumference of the hat) are also recommended for sun protection.   ELASTOSIS OF SKIN   Facial Elastosis  Location: See attached image  Informed consent: Discussed risks (infection, pain, bleeding, bruising, swelling, allergic reaction, paralysis of nearby muscles, eyelid droop, double vision, neck weakness, difficulty breathing, headache, undesirable cosmetic result, and need for additional treatment) and benefits of the procedure, as well as the alternatives.  Informed consent was obtained.  Preparation: The area was cleansed with alcohol.  Procedure Details:  Botox was injected into the dermis with a 30-gauge needle. Pressure applied to any bleeding. Ice packs offered for swelling.  Lot Number:  [id number] Expiration:  11/2025  Total Units  Injected:  30  Plan: Tylenol may be used for headache.  Allow 2 weeks before returning to clinic for additional dosing as needed. Patient will call for any problems.  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  Typically we recommend at least 1-3 treatment sessions about 5-8 weeks apart for best results.  Cannot have tanned skin when BBL performed, and regular use of sunscreen/photoprotection is advised after the procedure to help maintain results. The patient's condition may also require maintenance treatments in the future.  The fee for BBL / laser treatments is $350 per treatment session for the whole face.  A fee can be quoted for other parts of the body.  Insurance typically does not pay for BBL/laser treatments and therefore the fee is an out-of-pocket cost. Recommend prophylactic valtrex treatment. Once scheduled for procedure, will send Rx in prior to patient's appointment.    Return for Botox in 3-4 months.  I, Jill Parcell, CMA, am acting as scribe for Celine Collard, MD.   Documentation: I have reviewed the above documentation for accuracy and completeness, and I agree with the above.  Celine Collard, MD

## 2024-01-20 ENCOUNTER — Encounter: Payer: Self-pay | Admitting: Dermatology

## 2024-04-18 ENCOUNTER — Ambulatory Visit (INDEPENDENT_AMBULATORY_CARE_PROVIDER_SITE_OTHER): Payer: Self-pay | Admitting: Dermatology

## 2024-04-18 DIAGNOSIS — L988 Other specified disorders of the skin and subcutaneous tissue: Secondary | ICD-10-CM

## 2024-04-18 NOTE — Progress Notes (Signed)
   Follow-Up Visit   Subjective  Cynthia Oliver is a 58 y.o. female who presents for the following: filler for facial elastosis  Daughter is with patient and contributes to history.   The following portions of the chart were reviewed this encounter and updated as appropriate: medications, allergies, medical history  Review of Systems:  No other skin or systemic complaints except as noted in HPI or Assessment and Plan.  Objective  Well appearing patient in no apparent distress; mood and affect are within normal limits.  A focused examination was performed of the face. Relevant physical exam findings are noted in the Assessment and Plan or shown in photos.  Before photos             After filler       Injection map photo     Assessment & Plan   ELASTOSIS OF SKIN   Facial Elastosis  Prior to the procedure, the patient's past medical history, allergies and the rare but potential risks and complications were reviewed with the patient and a signed consent was obtained. Pre and post-treatment care was discussed and instructions provided.   Location: perioral, marionette lines, and chin, upper  Filler Type: Restylane refyne Lot No. [id number] Exp 05/01/2025  Procedure: The area was prepped thoroughly with Puracyn. After introducing the needle into the desired treatment area, the syringe plunger was drawn back to ensure there was no flash of blood prior to injecting the filler in order to minimize risk of intravascular injection and vascular occlusion. After injection of the filler, the treated areas were cleansed and iced to reduce swelling. Post-treatment instructions were reviewed with the patient.       Patient tolerated the procedure well. The patient will call with any problems, questions or concerns prior to their next appointment.   Return if symptoms worsen or fail to improve.  IAndrea Kerns, CMA, am acting as scribe for Rexene Rattler, MD  .   Documentation: I have reviewed the above documentation for accuracy and completeness, and I agree with the above.  Rexene Rattler, MD

## 2024-04-18 NOTE — Progress Notes (Deleted)
   Follow-Up Visit   Subjective  Cynthia Oliver is a 58 y.o. female who presents for the following: ***   The following portions of the chart were reviewed this encounter and updated as appropriate: medications, allergies, medical history  Review of Systems:  No other skin or systemic complaints except as noted in HPI or Assessment and Plan.  Objective  Well appearing patient in no apparent distress; mood and affect are within normal limits.   A focused examination was performed of the following areas: ***  Relevant exam findings are noted in the Assessment and Plan.    Assessment & Plan   FACIAL ELASTOSIS Exam: Rhytides and volume loss.  Treatment Plan: ***  Recommend daily broad spectrum sunscreen SPF 30+ to sun-exposed areas, reapply every 2 hours as needed. Call for new or changing lesions.  Staying in the shade or wearing long sleeves, sun glasses (UVA+UVB protection) and wide brim hats (4-inch brim around the entire circumference of the hat) are also recommended for sun protection.      No follow-ups on file.  I, Emerick Ege, CMA am acting as scribe for Rexene Rattler, MD.   Documentation: I have reviewed the above documentation for accuracy and completeness, and I agree with the above.  Rexene Rattler, MD

## 2024-04-18 NOTE — Patient Instructions (Signed)

## 2024-05-01 ENCOUNTER — Ambulatory Visit (INDEPENDENT_AMBULATORY_CARE_PROVIDER_SITE_OTHER): Payer: Self-pay | Admitting: Dermatology

## 2024-05-01 ENCOUNTER — Encounter: Payer: Self-pay | Admitting: Dermatology

## 2024-05-01 DIAGNOSIS — L988 Other specified disorders of the skin and subcutaneous tissue: Secondary | ICD-10-CM

## 2024-05-01 NOTE — Patient Instructions (Signed)

## 2024-05-01 NOTE — Progress Notes (Unsigned)
   Follow-Up Visit   Subjective  Cynthia Oliver is a 58 y.o. female who presents for the following: Botox for facial elastosis  Patient accompanied by daughter who contributes to history.  The following portions of the chart were reviewed this encounter and updated as appropriate: medications, allergies, medical history  Review of Systems:  No other skin or systemic complaints except as noted in HPI or Assessment and Plan.  Objective  Well appearing patient in no apparent distress; mood and affect are within normal limits.  A focused examination was performed of the face.  Relevant physical exam findings are noted in the Assessment and Plan.  Injection map photo     Assessment & Plan   ELASTOSIS OF SKIN   Facial Elastosis Botox 30 units injected today to: - Frown Complex 20 units - Crow's feet 5 units x 2  Location: frown complex, crow's feet  Informed consent: Discussed risks (infection, pain, bleeding, bruising, swelling, allergic reaction, paralysis of nearby muscles, eyelid droop, double vision, neck weakness, difficulty breathing, headache, undesirable cosmetic result, and need for additional treatment) and benefits of the procedure, as well as the alternatives.  Informed consent was obtained.  Preparation: The area was cleansed with alcohol.  Procedure Details:  Botox was injected into the dermis with a 30-gauge needle. Pressure applied to any bleeding. Ice packs offered for swelling.  Lot Number:  [id number] Expiration:  05/2026  Total Units Injected:  30  Plan: Tylenol may be used for headache.  Allow 2 weeks before returning to clinic for additional dosing as needed. Patient will call for any problems.  Return for 3-21m Botox.  I, Grayce Saunas, RMA, am acting as scribe for Alm Rhyme, MD .   Documentation: I have reviewed the above documentation for accuracy and completeness, and I agree with the above.  Alm Rhyme, MD

## 2024-05-02 ENCOUNTER — Encounter: Payer: Self-pay | Admitting: Dermatology

## 2024-05-07 ENCOUNTER — Ambulatory Visit: Payer: Self-pay | Admitting: Dermatology

## 2024-08-07 ENCOUNTER — Ambulatory Visit: Payer: Self-pay | Admitting: Dermatology

## 2024-08-23 ENCOUNTER — Encounter: Payer: Self-pay | Admitting: Dermatology

## 2024-08-23 ENCOUNTER — Ambulatory Visit (INDEPENDENT_AMBULATORY_CARE_PROVIDER_SITE_OTHER): Payer: Self-pay | Admitting: Dermatology

## 2024-08-23 DIAGNOSIS — L988 Other specified disorders of the skin and subcutaneous tissue: Secondary | ICD-10-CM

## 2024-08-23 NOTE — Patient Instructions (Signed)

## 2024-08-23 NOTE — Progress Notes (Signed)
" ° °  Follow-Up Visit   Subjective  Cynthia Oliver is a 59 y.o. female who presents for the following: 4 months f/u Botox for facial elastosis  Daughter is with patient and contributes to history.   The following portions of the chart were reviewed this encounter and updated as appropriate: medications, allergies, medical history  Review of Systems:  No other skin or systemic complaints except as noted in HPI or Assessment and Plan.  Objective  Well appearing patient in no apparent distress; mood and affect are within normal limits.  A focused examination was performed of the face.  Relevant physical exam findings are noted in the Assessment and Plan.     Assessment & Plan    Facial Elastosis Botox 30 units injected today to: - Frown Complex 20 units - Crow's feet 5 units x 2  Location: See attached image  Informed consent: Discussed risks (infection, pain, bleeding, bruising, swelling, allergic reaction, paralysis of nearby muscles, eyelid droop, double vision, neck weakness, difficulty breathing, headache, undesirable cosmetic result, and need for additional treatment) and benefits of the procedure, as well as the alternatives.  Informed consent was obtained.  Preparation: The area was cleansed with alcohol.  Procedure Details:  Botox was injected into the dermis with a 30-gauge needle. Pressure applied to any bleeding. Ice packs offered for swelling.  Lot Number:  [id number] Expiration:  07/2026  Total Units Injected:  30  Plan: Tylenol may be used for headache.  Allow 2 weeks before returning to clinic for additional dosing as needed. Patient will call for any problems.  Return in about 3 months (around 11/21/2024) for botox.  IFay Kirks, CMA, am acting as scribe for Alm Rhyme, MD .   Documentation: I have reviewed the above documentation for accuracy and completeness, and I agree with the above.  Alm Rhyme, MD      "

## 2024-11-29 ENCOUNTER — Ambulatory Visit: Payer: Self-pay | Admitting: Dermatology
